# Patient Record
Sex: Female | Born: 1935
Health system: Southern US, Community
[De-identification: ages and names within clinical notes are randomized; demographics above are authoritative.]

## PROBLEM LIST (undated history)

## (undated) DIAGNOSIS — I10 Essential (primary) hypertension: Secondary | ICD-10-CM

## (undated) DIAGNOSIS — E785 Hyperlipidemia, unspecified: Secondary | ICD-10-CM

## (undated) DIAGNOSIS — F32A Depression, unspecified: Secondary | ICD-10-CM

## (undated) DIAGNOSIS — F419 Anxiety disorder, unspecified: Secondary | ICD-10-CM

## (undated) DIAGNOSIS — F0390 Unspecified dementia without behavioral disturbance: Secondary | ICD-10-CM

## (undated) DIAGNOSIS — F329 Major depressive disorder, single episode, unspecified: Secondary | ICD-10-CM

## (undated) DIAGNOSIS — A310 Pulmonary mycobacterial infection: Secondary | ICD-10-CM

## (undated) DIAGNOSIS — C4490 Unspecified malignant neoplasm of skin, unspecified: Secondary | ICD-10-CM

## (undated) DIAGNOSIS — K589 Irritable bowel syndrome without diarrhea: Secondary | ICD-10-CM

## (undated) HISTORY — DX: Essential (primary) hypertension: I10

## (undated) HISTORY — DX: Unspecified malignant neoplasm of skin, unspecified: C44.90

## (undated) HISTORY — DX: Depression, unspecified: F32.A

## (undated) HISTORY — DX: Anxiety disorder, unspecified: F41.9

## (undated) HISTORY — PX: ABDOMINAL HYSTERECTOMY: SHX81

## (undated) HISTORY — DX: Major depressive disorder, single episode, unspecified: F32.9

## (undated) HISTORY — DX: Hyperlipidemia, unspecified: E78.5

## (undated) HISTORY — DX: Irritable bowel syndrome, unspecified: K58.9

## (undated) HISTORY — DX: Pulmonary mycobacterial infection: A31.0

## (undated) HISTORY — PX: CATARACT EXTRACTION, BILATERAL: SHX1313

## (undated) HISTORY — PX: TONSILLECTOMY: SUR1361

## (undated) HISTORY — PX: BLADDER REPAIR: SHX76

---

## 2000-10-29 LAB — HM COLONOSCOPY: HM Colonoscopy: NORMAL

## 2001-04-23 DIAGNOSIS — A310 Pulmonary mycobacterial infection: Secondary | ICD-10-CM

## 2001-04-23 HISTORY — DX: Pulmonary mycobacterial infection: A31.0

## 2009-09-26 ENCOUNTER — Ambulatory Visit: Payer: Self-pay | Admitting: Internal Medicine

## 2010-12-26 ENCOUNTER — Encounter: Payer: Self-pay | Admitting: Internal Medicine

## 2010-12-26 ENCOUNTER — Other Ambulatory Visit: Payer: Self-pay | Admitting: Internal Medicine

## 2010-12-26 ENCOUNTER — Ambulatory Visit (INDEPENDENT_AMBULATORY_CARE_PROVIDER_SITE_OTHER): Payer: Medicare Other | Admitting: Internal Medicine

## 2010-12-26 VITALS — BP 110/66 | HR 66 | Temp 98.1°F | Resp 12 | Ht <= 58 in | Wt 123.8 lb

## 2010-12-26 DIAGNOSIS — F329 Major depressive disorder, single episode, unspecified: Secondary | ICD-10-CM | POA: Insufficient documentation

## 2010-12-26 DIAGNOSIS — F3289 Other specified depressive episodes: Secondary | ICD-10-CM

## 2010-12-26 DIAGNOSIS — I10 Essential (primary) hypertension: Secondary | ICD-10-CM | POA: Insufficient documentation

## 2010-12-26 DIAGNOSIS — A310 Pulmonary mycobacterial infection: Secondary | ICD-10-CM

## 2010-12-26 DIAGNOSIS — E785 Hyperlipidemia, unspecified: Secondary | ICD-10-CM | POA: Insufficient documentation

## 2010-12-26 NOTE — Progress Notes (Signed)
Subjective:    Patient ID: Maria Bass, female    DOB: [birth year], 77 y.o.   MRN: [id number]  HPI Maria Bass is a 77 year old female with a history of MAI who presents for followup. She reports she scheduled this visit for consultation as she has been concerned about chronic cough and increased mucous production over the last few months. She contacted her pulmonary physician, Dr. Valentina Lucks, at [facility] after an episode of hemoptysis. He instructed her that if this was a single event he did not feel that she needed to be seen. She has not had any recurrent hemoptysis. She is, however, concerned that she should have chronic pulmonary followup. She request referral to a local pulmonologist. Aside from chronic cough with mucous production which is most prominent in the mornings, she denies any other symptoms. She denies any shortness of breath, fever, weight loss.  Maria Bass also has a history of irritable bowel syndrome. She reports a recent two-week interval with frequent diarrhea, which has now resolved. She would also like to set up a referral to a GI physician. We discussed that she is due for colonoscopy. She denies any current abdominal pain, diarrhea, blood in her stool, or other symptoms.   Outpatient Encounter Prescriptions as of 12/26/2010  Medication Sig Dispense Refill  . enalapril (VASOTEC) 10 MG tablet       . LIPITOR 20 MG tablet       . mirtazapine (REMERON) 45 MG tablet Take 45 mg by mouth as needed.          Review of Systems  Constitutional: Negative for fever, chills and unexpected weight change.  HENT: Negative for ear pain, congestion, sore throat, rhinorrhea, sneezing, mouth sores, trouble swallowing, neck stiffness, voice change, postnasal drip, sinus pressure and ear discharge.   Eyes: Negative for pain, discharge, redness and visual disturbance.  Respiratory: Positive for cough. Negative for chest tightness, shortness of breath, wheezing and stridor.   Cardiovascular:  Negative for chest pain, palpitations and leg swelling.  Gastrointestinal: Positive for diarrhea (intermittant). Negative for abdominal pain and blood in stool.  Musculoskeletal: Negative for myalgias and arthralgias.  Skin: Negative for color change and rash.  Neurological: Negative for dizziness, weakness, light-headedness and headaches.  Hematological: Negative for adenopathy.    BP 110/66  Pulse 66  Temp(Src) 98.1 F (36.7 C) (Oral)  Resp 12  Ht 4\' 4"  (1.321 m)  Wt 123 lb 12 oz (56.133 kg)  BMI 32.18 kg/m2  SpO2 97%     Objective:   Physical Exam  Constitutional: She is oriented to person, place, and time. She appears well-developed and well-nourished. No distress.  HENT:  Head: Normocephalic and atraumatic.  Right Ear: External ear normal.  Left Ear: External ear normal.  Nose: Nose normal.  Eyes: Conjunctivae and EOM are normal. Pupils are equal, round, and reactive to light. Right eye exhibits no discharge. Left eye exhibits no discharge. No scleral icterus.  Neck: Normal range of motion. Neck supple. No tracheal deviation present. No thyromegaly present.  Cardiovascular: Normal rate, regular rhythm, normal heart sounds and intact distal pulses.  Exam reveals no gallop and no friction rub.   No murmur heard. Pulmonary/Chest: Effort normal and breath sounds normal. No respiratory distress. She has no wheezes. She has no rales. She exhibits no tenderness.  Musculoskeletal: Normal range of motion. She exhibits no edema and no tenderness.  Lymphadenopathy:    She has no cervical adenopathy.  Neurological: She is alert and oriented to person,  place, and time. No cranial nerve deficit. She exhibits normal muscle tone. Coordination normal.  Skin: Skin is warm and dry. No rash noted. She is not diaphoretic. No erythema. No pallor.  Psychiatric: She has a normal mood and affect. Her speech is normal and behavior is normal. Judgment and thought content normal. Cognition and memory  are normal.          Assessment & Plan:  1. MAI - patient with a history of MAI, previously followed by Dr. Zackery Barefoot at [facility]. She has had a recurrent single episode of hemoptysis and reports chronic productive cough. She would like to establish care with a local pulmonologist. Exam is normal today. Recommended obtaining a chest x-ray today, however she would prefer to defer this until she is seen by pulmonary medicine. We will set her up with a [facility] pulmonology. We will try to obtain her previous records from Dr. Zackery Barefoot.  2. IBS - patient also has a history of irritable bowel syndrome. She had a recent two-week episode of diarrhea, but denies any symptoms currently. She is due for colonoscopy. We will set her up with GI for both her screening colonoscopy and evaluation of chronic irritable bowel syndrome.

## 2010-12-26 NOTE — Patient Instructions (Signed)
We will set you up with pulmonary. Follow up here in December

## 2011-01-01 ENCOUNTER — Ambulatory Visit: Payer: Self-pay | Admitting: Internal Medicine

## 2011-01-02 ENCOUNTER — Encounter: Payer: Self-pay | Admitting: Internal Medicine

## 2011-01-31 ENCOUNTER — Encounter: Payer: Self-pay | Admitting: Internal Medicine

## 2011-02-20 ENCOUNTER — Telehealth: Payer: Self-pay | Admitting: Internal Medicine

## 2011-02-20 ENCOUNTER — Ambulatory Visit (INDEPENDENT_AMBULATORY_CARE_PROVIDER_SITE_OTHER)
Admission: RE | Admit: 2011-02-20 | Discharge: 2011-02-20 | Disposition: A | Discharge status: Home / Self Care | Payer: Medicare Other | Source: Ambulatory Visit | Attending: Critical Care Medicine | Admitting: Critical Care Medicine

## 2011-02-20 ENCOUNTER — Encounter: Payer: Self-pay | Admitting: Critical Care Medicine

## 2011-02-20 ENCOUNTER — Other Ambulatory Visit: Payer: Medicare Other

## 2011-02-20 ENCOUNTER — Ambulatory Visit (INDEPENDENT_AMBULATORY_CARE_PROVIDER_SITE_OTHER): Payer: Medicare Other | Admitting: Critical Care Medicine

## 2011-02-20 ENCOUNTER — Other Ambulatory Visit: Payer: Self-pay | Admitting: Critical Care Medicine

## 2011-02-20 DIAGNOSIS — J479 Bronchiectasis, uncomplicated: Secondary | ICD-10-CM | POA: Insufficient documentation

## 2011-02-20 NOTE — Progress Notes (Signed)
Subjective:    Patient ID: Maria Bass, female    DOB: [birth year], 77 y.o.   MRN: [id number]  HPI 77 y.o. WF hx of MAI Was followed by Valentina Lucks at [place].  Not curable.  Was rx with ABX for ? Period of time ended 3/07 Has seen Sandy Salaam at [facility].     Now here for second opinion,  Now lives in [place].  Started coughing up blood again. Coughed up blood last episode July 2012.  No chest pain.   Last imaging has been several years.  No fever.  No night sweats.    Has mucus daily that is yellow and thick. Mucus comes mid day or if will lay flat and has to cough up mucus.  Also 5pm in the PM will have mucus. No qhs cough.  No real heartburn. No dysphagia.  Had hemoptysis in 03.  No real sinus issues. Dx bronchiectasis.   Past Medical History  Diagnosis Date  . Hypertension   . Hyperlipidemia   . MAI (mycobacterium avium-intracellulare) 2003  . Depression   . IBS (irritable bowel syndrome)      Family History  Problem Relation Age of Onset  . Heart attack Mother   . Heart attack Father      History   Social History  . Marital Status: Married    Spouse Name: N/A    Number of Children: N/A  . Years of Education: N/A   Occupational History  . Retired    Social History Main Topics  . Smoking status: Former Smoker    Types: Cigarettes    Quit date: 04/23/1978  . Smokeless tobacco: Never Used   Comment: social smoker x 15  . Alcohol Use: Yes     glass a wine at night  . Drug Use: No  . Sexually Active: Not on file   Other Topics Concern  . Not on file   Social History Narrative  . No narrative on file     No Known Allergies   Outpatient Prescriptions Prior to Visit  Medication Sig Dispense Refill  . enalapril (VASOTEC) 10 MG tablet Take 10 mg by mouth daily.       [name] LIPITOR 20 MG tablet Take 10 mg by mouth daily.       . mirtazapine (REMERON) 45 MG tablet Take 45 mg by mouth as needed.            Review of Systems  Constitutional: Negative for fever,  chills, diaphoresis, activity change, appetite change, fatigue and unexpected weight change.  HENT: Positive for congestion and sneezing. Negative for hearing loss, ear pain, nosebleeds, sore throat, facial swelling, rhinorrhea, mouth sores, trouble swallowing, neck pain, neck stiffness, dental problem, voice change, postnasal drip, sinus pressure, tinnitus and ear discharge.   Eyes: Negative for photophobia, discharge, itching and visual disturbance.  Respiratory: Positive for cough. Negative for apnea, choking, chest tightness, shortness of breath, wheezing and stridor.   Cardiovascular: Negative for chest pain, palpitations and leg swelling.  Gastrointestinal: Negative for nausea, vomiting, abdominal pain, constipation, blood in stool and abdominal distention.  Genitourinary: Negative for dysuria, urgency, frequency, hematuria, flank pain, decreased urine volume and difficulty urinating.  Musculoskeletal: Negative for myalgias, back pain, joint swelling, arthralgias and gait problem.  Skin: Negative for color change, pallor and rash.  Neurological: Negative for dizziness, tremors, seizures, syncope, speech difficulty, weakness, light-headedness, numbness and headaches.  Hematological: Negative for adenopathy. Does not bruise/bleed easily.  Psychiatric/Behavioral: Negative for confusion, sleep disturbance  and agitation. The patient is not nervous/anxious.        Objective:   Physical Exam Filed Vitals:   02/20/11 1056  BP: 118/68  Pulse: 65  Temp: 97.9 F (36.6 C)  TempSrc: Oral  Height: 5\' 2"  (1.575 m)  Weight: 123 lb 3.2 oz (55.883 kg)  SpO2: 96%    Gen: Pleasant, well-nourished, in no distress,  normal affect  ENT: No lesions,  mouth clear,  oropharynx clear, no postnasal drip  Neck: No JVD, no TMG, no carotid bruits  Lungs: No use of accessory muscles, no dullness to percussion, scattered bibasilar  rhonchi  Cardiovascular: RRR, heart sounds normal, no murmur or gallops, no  peripheral edema  Abdomen: soft and NT, no HSM,  BS normal  Musculoskeletal: No deformities, no cyanosis or clubbing  Neuro: alert, non focal  Skin: Warm, no lesions or rashes        Assessment & Plan:   Bronchiectasis Bronchiectasis by clinical history and exam with incomplete database Prior history of Mycobacterium avium intracellular  by report need confirmation with old records Patient has active ongoing purulent sputum The patient was able to produce sputum here in the office Plan Obtain pulmonary function studies Obtain CT scan of the chest Obtain old records Reculture sputum for MAI and routine bacteria Stop fish oil Further treatment adjustments will follow Discontinue ACE inhibitor and switch to an ARB this was discussed with the patient's primary care physician on    Updated Medication List Outpatient Encounter Prescriptions as of 02/20/2011  Medication Sig Dispense Refill  . Ascorbic Acid (VITAMIN C) 1000 MG tablet Take 1,000 mg by mouth daily.        . Biotin 1000 MCG tablet Take 1,000 mcg by mouth daily.        . cholecalciferol (VITAMIN D) 1000 UNITS tablet Take 1,000 Units by mouth daily.        . enalapril (VASOTEC) 10 MG tablet Take 10 mg by mouth daily.       [name] LIPITOR 20 MG tablet Take 10 mg by mouth daily.       . mirtazapine (REMERON) 45 MG tablet Take 45 mg by mouth as needed.        [name] DISCONTD: Omega-3 Fatty Acids (FISH OIL) 1000 MG CAPS Take 1 capsule by mouth daily.

## 2011-02-20 NOTE — Telephone Encounter (Signed)
Patient is currently taking Enalapril. Her pulmonologist, Dr. Delford Field, called today and he would like to change her to Losartan, as enalapril can sometimes cause a chronic cough. We can call in Losartan 50mg  daily and have her STOP enalapril.

## 2011-02-20 NOTE — Patient Instructions (Addendum)
I will ask Dr Dan Humphreys to consider switching enalipril to another agent Stop fish oil Records from [facility]/ [place] will be obtained CT Chest will be obtained Sputum culture will be obtained Full pulmonary function study will be obtained No other medication changes for now Return one month, I may ask dr Kendrick Fries to take your case, our new lung doctor in [place] who works out of Dr Tilman Neat office

## 2011-02-21 NOTE — Assessment & Plan Note (Addendum)
Bronchiectasis by clinical history and exam with incomplete database Prior history of Mycobacterium avium intracellular  by report need confirmation with old records Patient has active ongoing purulent sputum The patient was able to produce sputum here in the office Plan Obtain pulmonary function studies Obtain CT scan of the chest Obtain old records Reculture sputum for MAI and routine bacteria Stop fish oil Further treatment adjustments will follow Discontinue ACE inhibitor and switch to an ARB this was discussed with the patient's primary care physician on

## 2011-02-22 LAB — RESPIRATORY CULTURE OR RESPIRATORY AND SPUTUM CULTURE

## 2011-02-23 ENCOUNTER — Telehealth: Payer: Self-pay | Admitting: Critical Care Medicine

## 2011-02-23 DIAGNOSIS — J471 Bronchiectasis with (acute) exacerbation: Secondary | ICD-10-CM

## 2011-02-23 MED ORDER — BUDESONIDE-FORMOTEROL FUMARATE 160-4.5 MCG/ACT IN AERO: 2.0000 | INHALATION_SPRAY | Freq: Two times a day (BID) | RESPIRATORY_TRACT | Status: DC

## 2011-02-23 MED ORDER — MOXIFLOXACIN HCL 400 MG PO TABS: 400.0000 mg | ORAL_TABLET | Freq: Every day | ORAL | Status: AC

## 2011-02-23 MED ORDER — LOSARTAN POTASSIUM 50 MG PO TABS: 50.0000 mg | ORAL_TABLET | Freq: Every day | ORAL | Status: DC

## 2011-02-23 MED ORDER — FLUTTER DEVI: Status: DC

## 2011-02-23 NOTE — Telephone Encounter (Signed)
Patient informed. 

## 2011-02-23 NOTE — Telephone Encounter (Signed)
CT shows cylindrical bronchiectasis  Sputum culture: normal flora Pt aware.  Will start avelox x7days, symbicort bid and flutter valve

## 2011-02-26 ENCOUNTER — Telehealth: Payer: Self-pay | Admitting: Critical Care Medicine

## 2011-02-26 NOTE — Telephone Encounter (Signed)
Spoke with pt. She states that she has appt with PW sched for 03/23/11 for rov with PFT's. She states that after speaking with PW on 02/23/11 about her results, she is thinking that he wants her to be seen sooner than this. She states that she is also questioning whether or not she should hold off on using flutter valve and symbicort until she has PFT's done. PW, pls advise, thanks!

## 2011-02-26 NOTE — Telephone Encounter (Signed)
Stay on all meds and flutter for now Have pt worked in sooner

## 2011-02-26 NOTE — Telephone Encounter (Signed)
LMTCB

## 2011-02-27 ENCOUNTER — Telehealth: Payer: Self-pay | Admitting: Critical Care Medicine

## 2011-02-27 NOTE — Telephone Encounter (Signed)
Spoke with patient-she states she does not have a flutter device-went to get one at [facility] and they don't carry them. I explained to patient we have them here in the office and she can come by and pick one up however the patient lives to far away ([place] area) to come by. I also have scheduled patient for PFT and ROV on 03-13-11; pt could not come in today. I advised patient that I will send this to PW to advise if he is okay with patient waiting until 03-13-11 to get a flutter device as well as the appt times for PFT and OV. PW not in office much between today and appt date scheduled. Will call patient asap.

## 2011-02-27 NOTE — Telephone Encounter (Signed)
Wait until next ov and give pt flutter valve then

## 2011-02-27 NOTE — Telephone Encounter (Signed)
I informed pt of PW's recommendations. Pt verbalized understanding.

## 2011-02-27 NOTE — Telephone Encounter (Signed)
Received copies from [facility],on 02/27/11. Forwarded  6pages to Dr. Sandra Cockayne review.

## 2011-03-01 ENCOUNTER — Telehealth: Payer: Self-pay | Admitting: Critical Care Medicine

## 2011-03-01 NOTE — Telephone Encounter (Signed)
Received 49 pages from [facility], forwarded to Dr. Delford Field for review. 03/01/11-ar

## 2011-03-02 ENCOUNTER — Other Ambulatory Visit: Payer: Self-pay | Admitting: *Deleted

## 2011-03-02 MED ORDER — LOSARTAN POTASSIUM 50 MG PO TABS: 50.0000 mg | ORAL_TABLET | Freq: Every day | ORAL | Status: DC

## 2011-03-02 NOTE — Progress Notes (Signed)
RX mailed to pt

## 2011-03-08 ENCOUNTER — Encounter: Payer: Self-pay | Admitting: Critical Care Medicine

## 2011-03-08 ENCOUNTER — Telehealth: Payer: Self-pay | Admitting: Critical Care Medicine

## 2011-03-08 NOTE — Telephone Encounter (Signed)
I spoke with the patient and advised that Dr. Delford Field was calling to let her know he received the [facility] records and he will discuss at upcoming ov on 03-13-11.  Also the AFB that was ordered on 02-20-11 was never done so the pt will need to try to give another sample at rov. Pt aware.Carron Curie, CMA

## 2011-03-13 ENCOUNTER — Ambulatory Visit (INDEPENDENT_AMBULATORY_CARE_PROVIDER_SITE_OTHER): Payer: Medicare Other | Admitting: Critical Care Medicine

## 2011-03-13 ENCOUNTER — Encounter: Payer: Self-pay | Admitting: Critical Care Medicine

## 2011-03-13 VITALS — BP 98/62 | HR 67 | Temp 98.3°F | Ht 62.0 in | Wt 124.0 lb

## 2011-03-13 DIAGNOSIS — J471 Bronchiectasis with (acute) exacerbation: Secondary | ICD-10-CM

## 2011-03-13 DIAGNOSIS — J479 Bronchiectasis, uncomplicated: Secondary | ICD-10-CM

## 2011-03-13 LAB — PULMONARY FUNCTION TEST

## 2011-03-13 MED ORDER — FLUTTER DEVI: Status: DC

## 2011-03-13 MED ORDER — BUDESONIDE-FORMOTEROL FUMARATE 160-4.5 MCG/ACT IN AERO: INHALATION_SPRAY | RESPIRATORY_TRACT | Status: DC

## 2011-03-13 MED ORDER — BUDESONIDE 180 MCG/ACT IN AEPB: 1.0000 | INHALATION_SPRAY | Freq: Two times a day (BID) | RESPIRATORY_TRACT | Status: DC

## 2011-03-13 NOTE — Patient Instructions (Signed)
When symbicort runs out , start pulmicort two puff twice daily Obtain a sputum for AFB and drop off at Dr Tilman Neat office to process Flutter valve 3 times daily Next appt with dr Kipp Brood mcquaid in two months in [place]

## 2011-03-13 NOTE — Assessment & Plan Note (Signed)
Bronchiectasis by clinical history and exam with incomplete database Prior history of Mycobacterium avium intracellular  by report need confirmation with old records CT Chest 10/12: diffuse cylindrical bronchiectasis Records from Drs Gladys Damme and Valentina Lucks [facility]: Dx Bronchiectasis Francetta Found  06/2001: hemoptysis, bronchiectasis/nodules on CT Chest.  Rx Azithro/Rif/ETH daily  06/2001 >>04/2003 reduced to thrice weekly>>>persistent pos smears/cultures.>>>D/C meds 02/2004 with pos cultures but minimal symptoms. 04/2004 Resumed Rx with Neb Amikacin 500/d, ETH/Azithro daily plus 3% NaCL neb.>>>stopped Rx 06/2004 >>>01/2005 AFB smear/cult neg x 3. ID clinic OVs 10/08>>more mucus, rx with neb 3% NaCl only Last seen [facility] 06/2008,  Min mucus and cough, no other issues. Due to asymptomatic for 88yrs, pt released from [facility] ID clinic 06/2008 Note MAC culture sent to [facility] 06/2002:  R to moxifloxacin,  Res to Amikacin, R to Cipro, Sus to Biaxin, R to Rifampin, Res to Kanamycin,  intermed to [facility] notes:  04/2003--02/2004:  FVC 105%  Fev1 107%  11/05 referral to Janace Hoard made .   ? Mass in L lung ? Resection, ultimately was watched in non smoker.  CT [facility] 09/2005:  Centrilobular nodules, tree in bud, assoc bronchiectasis diffuse, all lobes.  09/2005 sputum cult  pos MAI 3 colonies only, smear neg.  MAI/bronchiectasis with mucus plugging.  Doubt will ever clear this pt from all MAI .  Need to check current AFB status.  Note PFTs normal now.  Needs to be on mucoclearance program. Pt does not need LABA. Plan Migrate from symbicort to pulmicort 2puff bid Flutter valve tid-qid No furhter ABX Refer to Dr Kendrick Fries in [place] office for f/u for more convenient location for the pt

## 2011-03-13 NOTE — Progress Notes (Signed)
PFT done today. 

## 2011-03-13 NOTE — Progress Notes (Signed)
Subjective:    Patient ID: Maria Bass, female    DOB: 1929/05/14, 77 y.o.   MRN: [id number]  HPI  77 y.o. WF hx of MAI/bronchiectasis  03/13/2011 Overall much improved with reduction in mucus production.   Pt denies any significant sore throat, nasal congestion or excess secretions, fever, chills, sweats, unintended weight loss, pleurtic or exertional chest pain, orthopnea PND, or leg swelling Pt denies any increase in rescue therapy over baseline, denies waking up needing it or having any early am or nocturnal exacerbations of coughing/wheezing/or dyspnea. Pt also denies any obvious fluctuation in symptoms with  weather or environmental change or other alleviating or aggravating factors  Now on symbicort.  Off all ABX.   Past Medical History  Diagnosis Date  . Hypertension   . Hyperlipidemia   . MAI (mycobacterium avium-intracellulare) 2003  . Depression   . IBS (irritable bowel syndrome)      Family History  Problem Relation Age of Onset  . Heart attack Mother   . Heart attack Father      History   Social History  . Marital Status: Married    Spouse Name: N/A    Number of Children: N/A  . Years of Education: N/A   Occupational History  . Retired    Social History Main Topics  . Smoking status: Former Smoker    Types: Cigarettes    Quit date: 04/23/1978  . Smokeless tobacco: Never Used   Comment: social smoker x 15  . Alcohol Use: Yes     glass a wine at night  . Drug Use: No  . Sexually Active: Not on file   Other Topics Concern  . Not on file   Social History Narrative  . No narrative on file     No Known Allergies   Outpatient Prescriptions Prior to Visit  Medication Sig Dispense Refill  . Ascorbic Acid (VITAMIN C) 1000 MG tablet Take 1,000 mg by mouth daily.        . Biotin 1000 MCG tablet Take 1,000 mcg by mouth daily.        . cholecalciferol (VITAMIN D) 1000 UNITS tablet Take 1,000 Units by mouth daily.        [name] LIPITOR 20 MG tablet  Take 20 mg by mouth daily.       [name] losartan (COZAAR) 50 MG tablet Take 1 tablet (50 mg total) by mouth daily.  90 tablet  3  . mirtazapine (REMERON) 45 MG tablet Take 45 mg by mouth as needed.        . budesonide-formoterol (SYMBICORT) 160-4.5 MCG/ACT inhaler Inhale 2 puffs into the lungs 2 (two) times daily.  1 Inhaler  12  . Respiratory Therapy Supplies (FLUTTER) DEVI Use 3-4 times daily  1 each  0      Review of Systems  Constitutional: Negative for fever, chills, diaphoresis, activity change, appetite change, fatigue and unexpected weight change.  HENT: Positive for congestion and sneezing. Negative for hearing loss, ear pain, nosebleeds, sore throat, facial swelling, rhinorrhea, mouth sores, trouble swallowing, neck pain, neck stiffness, dental problem, voice change, postnasal drip, sinus pressure, tinnitus and ear discharge.   Eyes: Negative for photophobia, discharge, itching and visual disturbance.  Respiratory: Positive for cough. Negative for apnea, choking, chest tightness, shortness of breath, wheezing and stridor.   Cardiovascular: Negative for chest pain, palpitations and leg swelling.  Gastrointestinal: Negative for nausea, vomiting, abdominal pain, constipation, blood in stool and abdominal distention.  Genitourinary: Negative for dysuria,  urgency, frequency, hematuria, flank pain, decreased urine volume and difficulty urinating.  Musculoskeletal: Negative for myalgias, back pain, joint swelling, arthralgias and gait problem.  Skin: Negative for color change, pallor and rash.  Neurological: Negative for dizziness, tremors, seizures, syncope, speech difficulty, weakness, light-headedness, numbness and headaches.  Hematological: Negative for adenopathy. Does not bruise/bleed easily.  Psychiatric/Behavioral: Negative for confusion, sleep disturbance and agitation. The patient is not nervous/anxious.        Objective:   Physical Exam  Filed Vitals:   03/13/11 1342  BP: 98/62   Pulse: 67  Temp: 98.3 F (36.8 C)  TempSrc: Oral  Height: 5\' 2"  (1.575 m)  Weight: 124 lb (56.246 kg)  SpO2: 94%    Gen: Pleasant, well-nourished, in no distress,  normal affect  ENT: No lesions,  mouth clear,  oropharynx clear, no postnasal drip  Neck: No JVD, no TMG, no carotid bruits  Lungs: No use of accessory muscles, no dullness to percussion, decreased  bibasilar  rhonchi  Cardiovascular: RRR, heart sounds normal, no murmur or gallops, no peripheral edema  Abdomen: soft and NT, no HSM,  BS normal  Musculoskeletal: No deformities, no cyanosis or clubbing  Neuro: alert, non focal  Skin: Warm, no lesions or rashes        Assessment & Plan:   Bronchiectasis Bronchiectasis by clinical history and exam with incomplete database Prior history of Mycobacterium avium intracellular  by report need confirmation with old records CT Chest 10/12: diffuse cylindrical bronchiectasis Records from Drs Gladys Damme and Valentina Lucks [facility]: Dx Bronchiectasis Francetta Found  06/2001: hemoptysis, bronchiectasis/nodules on CT Chest.  Rx Azithro/Rif/ETH daily  06/2001 >>04/2003 reduced to thrice weekly>>>persistent pos smears/cultures.>>>D/C meds 02/2004 with pos cultures but minimal symptoms. 04/2004 Resumed Rx with Neb Amikacin 500/d, ETH/Azithro daily plus 3% NaCL neb.>>>stopped Rx 06/2004 >>>01/2005 AFB smear/cult neg x 3. ID clinic OVs 10/08>>more mucus, rx with neb 3% NaCl only Last seen [facility] 06/2008,  Min mucus and cough, no other issues. Due to asymptomatic for 37yrs, pt released from [facility] ID clinic 06/2008 Note MAC culture sent to [facility] 06/2002:  R to moxifloxacin,  Res to Amikacin, R to Cipro, Sus to Biaxin, R to Rifampin, Res to Kanamycin,  intermed to [facility] notes:  04/2003--02/2004:  FVC 105%  Fev1 107%  11/05 referral to [facility] made .   ? Mass in L lung ? Resection, ultimately was watched in non smoker.  CT CHest 09/2005:  Centrilobular nodules, tree in bud, assoc bronchiectasis  diffuse, all lobes.  09/2005 sputum cult  pos MAI 3 colonies only, smear neg.  MAI/bronchiectasis with mucus plugging.  Doubt will ever clear this pt from all MAI .  Need to check current AFB status.  Note PFTs normal now.  Needs to be on mucoclearance program. Pt does not need LABA. Plan Migrate from symbicort to pulmicort 2puff bid Flutter valve tid-qid No furhter ABX Refer to Dr Kendrick Fries in [place] office for f/u for more convenient location for the pt     Updated Medication List Outpatient Encounter Prescriptions as of 03/13/2011  Medication Sig Dispense Refill  . Ascorbic Acid (VITAMIN C) 1000 MG tablet Take 1,000 mg by mouth daily.        . Biotin 1000 MCG tablet Take 1,000 mcg by mouth daily.        . budesonide-formoterol (SYMBICORT) 160-4.5 MCG/ACT inhaler Stop when current inhaler runs out  1 Inhaler  12  . cholecalciferol (VITAMIN D) 1000 UNITS tablet  Take 1,000 Units by mouth daily.        [name] LIPITOR 20 MG tablet Take 20 mg by mouth daily.       [name] losartan (COZAAR) 50 MG tablet Take 1 tablet (50 mg total) by mouth daily.  90 tablet  3  . mirtazapine (REMERON) 45 MG tablet Take 45 mg by mouth as needed.        [name] Respiratory Therapy Supplies (FLUTTER) DEVI Use 3-4 times daily  1 each  0  . DISCONTD: budesonide-formoterol (SYMBICORT) 160-4.5 MCG/ACT inhaler Inhale 2 puffs into the lungs 2 (two) times daily.  1 Inhaler  12  . DISCONTD: Respiratory Therapy Supplies (FLUTTER) DEVI Use 3-4 times daily  1 each  0  . budesonide (PULMICORT FLEXHALER) 180 MCG/ACT inhaler Inhale 1 puff into the lungs 2 (two) times daily. Start after symbicort runs out  1 each  6

## 2011-03-20 ENCOUNTER — Telehealth: Payer: Self-pay | Admitting: Internal Medicine

## 2011-03-20 NOTE — Telephone Encounter (Signed)
Kidney and liver function normal on labs from 03/19/2011

## 2011-03-21 ENCOUNTER — Ambulatory Visit: Payer: Medicare Other | Admitting: Critical Care Medicine

## 2011-03-21 NOTE — Telephone Encounter (Signed)
Patient informed. 

## 2011-03-23 ENCOUNTER — Ambulatory Visit: Payer: Medicare Other | Admitting: Critical Care Medicine

## 2011-03-26 ENCOUNTER — Telehealth: Payer: Self-pay | Admitting: Internal Medicine

## 2011-03-26 ENCOUNTER — Telehealth: Payer: Self-pay | Admitting: Critical Care Medicine

## 2011-03-26 ENCOUNTER — Encounter: Payer: Self-pay | Admitting: Internal Medicine

## 2011-03-26 ENCOUNTER — Ambulatory Visit (INDEPENDENT_AMBULATORY_CARE_PROVIDER_SITE_OTHER): Payer: Medicare Other | Admitting: Internal Medicine

## 2011-03-26 VITALS — BP 110/62 | HR 63 | Temp 98.2°F | Wt 122.0 lb

## 2011-03-26 DIAGNOSIS — I10 Essential (primary) hypertension: Secondary | ICD-10-CM

## 2011-03-26 DIAGNOSIS — J479 Bronchiectasis, uncomplicated: Secondary | ICD-10-CM

## 2011-03-26 DIAGNOSIS — E785 Hyperlipidemia, unspecified: Secondary | ICD-10-CM

## 2011-03-26 MED ORDER — ATORVASTATIN CALCIUM 20 MG PO TABS: 20.0000 mg | ORAL_TABLET | Freq: Every day | ORAL | Status: DC

## 2011-03-26 NOTE — Telephone Encounter (Signed)
[phone] I called [facility] and spoke with lisa she had no record of Ms Radloff having the pneumonia vaccine.  Also called pulmonary at [facility] the are going to call ms Pollick about the nurse visit

## 2011-03-26 NOTE — Progress Notes (Signed)
Subjective:    Patient ID: Maria Bass, female    DOB: 1929-05-18, 77 y.o.   MRN: [id number]  HPI 77 year old female with history of MAI and bronchiectasis as well as hypertension and hyperlipidemia presents for followup. She reports that she's been feeling very well. She notes that her pulmonary physician is planning to start her on a flutter device. She is also transitioning from Symbicort 2 Pulmicort. She reports that her cough is improved. She continues to have occasional cough which is productive of yellow sputum. She denies any shortness of breath. She denies any fever or chills.  In regards to her hypertension and hyperlipidemia, she reports full compliance with her medications. She brings record of recent lab work performed in November 2012 which showed normal kidney and liver function as well as a normal urine microalbumin.  Outpatient Encounter Prescriptions as of 03/26/2011  Medication Sig Dispense Refill  . Ascorbic Acid (VITAMIN C) 1000 MG tablet Take 1,000 mg by mouth daily.        . Biotin 1000 MCG tablet Take 1,000 mcg by mouth daily.        . budesonide (PULMICORT FLEXHALER) 180 MCG/ACT inhaler Inhale 1 puff into the lungs 2 (two) times daily. Start after symbicort runs out  1 each  6  . budesonide-formoterol (SYMBICORT) 160-4.5 MCG/ACT inhaler Stop when current inhaler runs out  1 Inhaler  12  . cholecalciferol (VITAMIN D) 1000 UNITS tablet Take 1,000 Units by mouth daily.        [name] LIPITOR 20 MG tablet Take 20 mg by mouth daily.       [name] losartan (COZAAR) 50 MG tablet Take 1 tablet (50 mg total) by mouth daily.  90 tablet  3  . mirtazapine (REMERON) 45 MG tablet Take 45 mg by mouth as needed.        [name] Respiratory Therapy Supplies (FLUTTER) DEVI Use 3-4 times daily  1 each  0    Review of Systems  Constitutional: Negative for fever, chills, appetite change, fatigue and unexpected weight change.  HENT: Negative for ear pain, congestion, sore throat, trouble swallowing,  neck pain, voice change and sinus pressure.   Eyes: Negative for visual disturbance.  Respiratory: Negative for cough, shortness of breath, wheezing and stridor.   Cardiovascular: Negative for chest pain, palpitations and leg swelling.  Gastrointestinal: Negative for nausea, vomiting, abdominal pain, diarrhea, constipation, blood in stool, abdominal distention and anal bleeding.  Genitourinary: Negative for dysuria and flank pain.  Musculoskeletal: Negative for myalgias, arthralgias and gait problem.  Skin: Negative for color change and rash.  Neurological: Negative for dizziness and headaches.  Hematological: Negative for adenopathy. Does not bruise/bleed easily.  Psychiatric/Behavioral: Negative for suicidal ideas, sleep disturbance and dysphoric mood. The patient is not nervous/anxious.    BP 110/62  Pulse 63  Temp(Src) 98.2 F (36.8 C) (Oral)  Wt 122 lb (55.339 kg)  SpO2 97%     Objective:   Physical Exam  Constitutional: She is oriented to person, place, and time. She appears well-developed and well-nourished. No distress.  HENT:  Head: Normocephalic and atraumatic.  Right Ear: External ear normal.  Left Ear: External ear normal.  Nose: Nose normal.  Mouth/Throat: Oropharynx is clear and moist. No oropharyngeal exudate.  Eyes: Conjunctivae are normal. Pupils are equal, round, and reactive to light. Right eye exhibits no discharge. Left eye exhibits no discharge. No scleral icterus.  Neck: Normal range of motion. Neck supple. No tracheal deviation present. No thyromegaly present.  Cardiovascular: Normal rate, regular rhythm, normal heart sounds and intact distal pulses.  Exam reveals no gallop and no friction rub.   No murmur heard. Pulmonary/Chest: Effort normal. No respiratory distress. She has decreased breath sounds (prolonged exp phase). She has no wheezes. She has no rales. She exhibits no tenderness.  Musculoskeletal: Normal range of motion. She exhibits no edema and no  tenderness.  Lymphadenopathy:    She has no cervical adenopathy.  Neurological: She is alert and oriented to person, place, and time. No cranial nerve deficit. She exhibits normal muscle tone. Coordination normal.  Skin: Skin is warm and dry. No rash noted. She is not diaphoretic. No erythema. No pallor.  Psychiatric: She has a normal mood and affect. Her behavior is normal. Judgment and thought content normal.          Assessment & Plan:  1. Hypertension -patient was recently transitioned from an ACE inhibitor to losartan. Her blood pressure is well-controlled today. Recent renal function in November 2012 is normal. Will plan to repeat renal function and urine microalbumin in May 2013.  2. Hyperlipidemia -will plan to recheck lipids in May of 2013. We'll continue Lipitor.  3.Bronchiectasis - followed by pulmonology. Patient reports improvement after recent round of antibiotics and treatment with Symbicort. She is planning to transition to Pulmicort. She is also planning to use a flutter device. She is unsure how to use this so we will set her up with the pulmonary nurse here in our clinic. She will followup with pulmonary in 2 months.

## 2011-03-26 NOTE — Telephone Encounter (Signed)
I spoke with pt and she states that she needs a time that she can go out to the [place] office to be shown how to use the flutter valve. Pt already has flutter valve. Pt did not want to come to [place] office to be shown.  I spoke with leslie and she states have pt come in Monday to be shown. Pt states she can't come in until Tuesday around 1:30-2. I advised pt that was fine and leslie is aware of this.

## 2011-03-26 NOTE — Telephone Encounter (Signed)
OK. It would be best if we could have the nurse visit here at [facility] with Dr. Ulyses Jarred nurse.  We should also bring her in for pneumonia vaccine.

## 2011-03-27 ENCOUNTER — Telehealth: Payer: Self-pay | Admitting: Critical Care Medicine

## 2011-03-27 NOTE — Telephone Encounter (Signed)
Spoke with pt she had her pneumonia shot 11/98 in [place]   Does she still need the pneumonia shot FYI She will be talking to lesley Dr Corey Skains nurse on 04/03/11 around 1:30 -200

## 2011-03-27 NOTE — Telephone Encounter (Signed)
Spoke to pt husband.  Made appointment for nurse visit 04/03/11 same day as ms Melman will see lesley dr Kendrick Fries nurse

## 2011-03-27 NOTE — Telephone Encounter (Signed)
Yes, please repeat pneumovax

## 2011-03-27 NOTE — Telephone Encounter (Signed)
Per phone note dated 03/26/11- the pt is to come to [facility] on Tues 04/03/11 between 1:30 and 2 pm so I can show her how to use the flutter valve. I called and spoke with Erie Noe and she is to relay the msg to [place] and have her call me back with any questions/concerns.

## 2011-03-28 ENCOUNTER — Encounter: Payer: Self-pay | Admitting: Critical Care Medicine

## 2011-03-30 ENCOUNTER — Telehealth: Payer: Self-pay | Admitting: Pulmonary Disease

## 2011-03-30 NOTE — Telephone Encounter (Signed)
I had spoke with the pt and she had agreed to come in on Monday 04/02/11 for me to teach her the flutter valve. There was no mention of pneumovax when I spoke with her.   TD tried calling Carollee Herter to discuss this issue and was placed on hold for 8 minutes. Will await a call back. TD to speak with [facility].

## 2011-04-02 ENCOUNTER — Ambulatory Visit (INDEPENDENT_AMBULATORY_CARE_PROVIDER_SITE_OTHER): Payer: Medicare Other | Admitting: *Deleted

## 2011-04-02 DIAGNOSIS — Z23 Encounter for immunization: Secondary | ICD-10-CM

## 2011-04-02 NOTE — Telephone Encounter (Signed)
Per TD- spoke with Erie Noe and this has been handled- pt to get pneumovax at nurse visit today and I will teach her the flutter valve as planned. Nothing further needed.

## 2011-04-03 ENCOUNTER — Ambulatory Visit: Payer: Medicare Other

## 2011-05-01 ENCOUNTER — Telehealth: Payer: Self-pay | Admitting: Internal Medicine

## 2011-05-01 NOTE — Telephone Encounter (Signed)
Appointment 05/04/11 @ 3  Pt aware of appointment

## 2011-05-01 NOTE — Telephone Encounter (Signed)
The pt was seen by Dr. Delford Field on 03/13/11 and was advised to schedule 2 month rov with Dr. Kendrick Fries after that visit. So she is due to see [place] this month. I am unsure why she never scheduled the appt, but we have openings this wk so you can schedule her to be seen then. Thanks!

## 2011-05-04 ENCOUNTER — Encounter: Payer: Self-pay | Admitting: Pulmonary Disease

## 2011-05-04 ENCOUNTER — Ambulatory Visit (INDEPENDENT_AMBULATORY_CARE_PROVIDER_SITE_OTHER): Payer: Medicare Other | Admitting: Pulmonary Disease

## 2011-05-04 DIAGNOSIS — J479 Bronchiectasis, uncomplicated: Secondary | ICD-10-CM

## 2011-05-04 DIAGNOSIS — R011 Cardiac murmur, unspecified: Secondary | ICD-10-CM | POA: Diagnosis not present

## 2011-05-04 NOTE — Patient Instructions (Signed)
If and when you produce sputum, please give [place] a sample and bring it by the office for a culture. Otherwise, continue your medications as you are doing. We will see you back in 3 months.

## 2011-05-04 NOTE — Progress Notes (Signed)
Subjective:    Patient ID: Maria Bass, female    DOB: [birth year], 78 y.o.   MRN: [id number]  HPI This is a very pleasant 78 y/o female followed by Drs. Francis Dowse, and Ramond Dial previously for bronchiectasis believed to be due to MAI.  She underwent antibiotic treatment in both 2003-2005 and 2006.  She states that she doesn't think the antibiotics helped much.  She has occassional flares requiting antibiotics and steroids.  She was recently treated with a steroids taper and did well with it.  She states that the flutter valve does not help with her sputum production as she has made very little since taking the steroids.  She continues to exercise daily and feels quite well today.  Past Medical History  Diagnosis Date  . Hypertension   . Hyperlipidemia   . MAI (mycobacterium avium-intracellulare) 2003  . Depression   . IBS (irritable bowel syndrome)      Family History  Problem Relation Age of Onset  . Heart attack Mother   . Heart attack Father      History   Social History  . Marital Status: Married    Spouse Name: N/A    Number of Children: N/A  . Years of Education: N/A   Occupational History  . Retired    Social History Main Topics  . Smoking status: Former Smoker    Types: Cigarettes    Quit date: 04/23/1978  . Smokeless tobacco: Never Used   Comment: social smoker x 15  . Alcohol Use: Yes     glass a wine at night  . Drug Use: No  . Sexually Active: Not on file   Other Topics Concern  . Not on file   Social History Narrative  . No narrative on file     No Known Allergies   Outpatient Prescriptions Prior to Visit  Medication Sig Dispense Refill  . Ascorbic Acid (VITAMIN C) 1000 MG tablet Take 1,000 mg by mouth daily.        [name] atorvastatin (LIPITOR) 20 MG tablet Take 1 tablet (20 mg total) by mouth daily.  90 tablet  3  . Biotin 1000 MCG tablet Take 1,000 mcg by mouth daily.        . budesonide (PULMICORT FLEXHALER) 180 MCG/ACT inhaler Inhale 1  puff into the lungs 2 (two) times daily. Start after symbicort runs out  1 each  6  . cholecalciferol (VITAMIN D) 1000 UNITS tablet Take 1,000 Units by mouth daily.        [name] losartan (COZAAR) 50 MG tablet Take 1 tablet (50 mg total) by mouth daily.  90 tablet  3  . mirtazapine (REMERON) 45 MG tablet Take 45 mg by mouth as needed.        [name] Respiratory Therapy Supplies (FLUTTER) DEVI Use 3-4 times daily  1 each  0  . budesonide-formoterol (SYMBICORT) 160-4.5 MCG/ACT inhaler Stop when current inhaler runs out  1 Inhaler  12       Review of Systems  Constitutional: Negative for fever, chills and unexpected weight change.  HENT: Negative for ear pain, nosebleeds, congestion, sore throat, rhinorrhea, sneezing, trouble swallowing, dental problem, voice change, postnasal drip and sinus pressure.   Eyes: Negative for visual disturbance.  Respiratory: Negative for cough, choking and shortness of breath.   Cardiovascular: Negative for chest pain and leg swelling.  Gastrointestinal: Negative for vomiting, abdominal pain and diarrhea.  Genitourinary: Negative for difficulty urinating.  Musculoskeletal: Negative for arthralgias.  Skin:  Negative for rash.  Neurological: Negative for tremors, syncope and headaches.  Hematological: Does not bruise/bleed easily.       Objective:   Physical Exam  Filed Vitals:   05/04/11 1457  BP: 106/60  Pulse: 76  Temp: 97.6 F (36.4 C)  TempSrc: Oral  Height: 5\' 2"  (1.575 m)  Weight: 56.155 kg (123 lb 12.8 oz)  SpO2: 98%   Gen: well appearing, no acute distress HEENT: NCAT, PERRL, EOMi, OP clear, neck supple without masses PULM: Insp crackles in bases bilaterally L > R (minimal), some rhonchi in upper lobes CV: RRR, systolic murmur RUSB crecs/descr, no JVD AB: BS+, soft, nontender, no hsm Ext: warm, no edema, no clubbing, no cyanosis Derm: no rash or skin breakdown Neuro: A&Ox4, CN II-XII intact, strength 5/5 in all 4 extremities       Assessment  & Plan:   Bronchiectasis This has been a stable interval for Maria Bass as she has very little sputum production and is doing well after a recent steroid taper.  She feels that the pulmicort is working well.  She continues to exercise regularly.  Plan: -keep the flutter valve, use when sputum production picks back up -give [place] a sputum culture when you can produce it -continue pulmicort -rtc 3 months.  Heart murmur Heart murmur, sounds aortic in nature.  We discussed getting an echo vs. discussing with PCP, she would prefer to discuss it with Dr. Dan Humphreys.  She does not have symptoms from it so this sounds reasonable.    Updated Medication List Outpatient Encounter Prescriptions as of 05/04/2011  Medication Sig Dispense Refill  . Ascorbic Acid (VITAMIN C) 1000 MG tablet Take 1,000 mg by mouth daily.        [name] atorvastatin (LIPITOR) 20 MG tablet Take 1 tablet (20 mg total) by mouth daily.  90 tablet  3  . Biotin 1000 MCG tablet Take 1,000 mcg by mouth daily.        . budesonide (PULMICORT FLEXHALER) 180 MCG/ACT inhaler Inhale 1 puff into the lungs 2 (two) times daily. Start after symbicort runs out  1 each  6  . cholecalciferol (VITAMIN D) 1000 UNITS tablet Take 1,000 Units by mouth daily.        [name] losartan (COZAAR) 50 MG tablet Take 1 tablet (50 mg total) by mouth daily.  90 tablet  3  . mirtazapine (REMERON) 45 MG tablet Take 45 mg by mouth as needed.        [name] Respiratory Therapy Supplies (FLUTTER) DEVI Use 3-4 times daily  1 each  0  . DISCONTD: budesonide-formoterol (SYMBICORT) 160-4.5 MCG/ACT inhaler Stop when current inhaler runs out  1 Inhaler  12

## 2011-05-04 NOTE — Assessment & Plan Note (Signed)
Heart murmur, sounds aortic in nature.  We discussed getting an echo vs. discussing with PCP, she would prefer to discuss it with Dr. Dan Humphreys.  She does not have symptoms from it so this sounds reasonable.

## 2011-05-04 NOTE — Assessment & Plan Note (Signed)
This has been a stable interval for [name] as she has very little sputum production and is doing well after a recent steroid taper.  She feels that the pulmicort is working well.  She continues to exercise regularly.  Plan: -keep the flutter valve, use when sputum production picks back up -give [place] a sputum culture when you can produce it -continue pulmicort -rtc 3 months.

## 2011-06-13 ENCOUNTER — Telehealth: Payer: Self-pay | Admitting: *Deleted

## 2011-06-13 NOTE — Telephone Encounter (Signed)
Pt left VM w/me b/c she was unsure how to leave a VM for Dr Kendrick Fries. She wants to know if she should continue pulmicort. Please let pt know, THANKS!!

## 2011-06-14 NOTE — Telephone Encounter (Signed)
Spoke with pt and advised that yes- she needs to stay on pulmicort. I gave her the number for our [place] office and advised if has questions/concerns to call our office. She verbalized understanding and states nothing further needed.

## 2011-06-27 ENCOUNTER — Other Ambulatory Visit: Payer: Self-pay | Admitting: *Deleted

## 2011-06-27 DIAGNOSIS — E785 Hyperlipidemia, unspecified: Secondary | ICD-10-CM

## 2011-06-27 MED ORDER — ATORVASTATIN CALCIUM 20 MG PO TABS: 20.0000 mg | ORAL_TABLET | Freq: Every day | ORAL | Status: DC

## 2011-08-06 ENCOUNTER — Ambulatory Visit (INDEPENDENT_AMBULATORY_CARE_PROVIDER_SITE_OTHER): Payer: Medicare Other | Admitting: Pulmonary Disease

## 2011-08-06 DIAGNOSIS — J479 Bronchiectasis, uncomplicated: Secondary | ICD-10-CM

## 2011-08-06 NOTE — Progress Notes (Deleted)
Subjective:    Patient ID: Maria Bass, female    DOB: Nov 07, 1929, 78 y.o.   MRN: [id number]  Synopsis: Maria Bass was referred to the [facility] [place] office in January 2013 after being  followed by Drs. Francis Dowse, and Ramond Dial previously for bronchiectasis believed to be due to MAI.  She underwent antibiotic treatment in both 2003-2005 and 2006.  She states that she doesn't think the antibiotics helped much.  She had occassional flares requiting antibiotics and steroids.  She was recently treated with a steroids taper and did well with it.  She stated then that the flutter valve does not help with her sputum production as she has made very little since taking the steroids.  HPI   Past Medical History  Diagnosis Date  . Hypertension   . Hyperlipidemia   . MAI (mycobacterium avium-intracellulare) 2003  . Depression   . IBS (irritable bowel syndrome)      Family History  Problem Relation Age of Onset  . Heart attack Mother   . Heart attack Father      History   Social History  . Marital Status: Married    Spouse Name: N/A    Number of Children: N/A  . Years of Education: N/A   Occupational History  . Retired    Social History Main Topics  . Smoking status: Former Smoker    Types: Cigarettes    Quit date: 04/23/1978  . Smokeless tobacco: Never Used   Comment: social smoker x 15  . Alcohol Use: Yes     glass a wine at night  . Drug Use: No  . Sexually Active: Not on file   Other Topics Concern  . Not on file   Social History Narrative  . No narrative on file     No Known Allergies   Outpatient Prescriptions Prior to Visit  Medication Sig Dispense Refill  . Ascorbic Acid (VITAMIN C) 1000 MG tablet Take 1,000 mg by mouth daily.        [name] atorvastatin (LIPITOR) 20 MG tablet Take 1 tablet (20 mg total) by mouth daily.  90 tablet  3  . Biotin 1000 MCG tablet Take 1,000 mcg by mouth daily.        . budesonide (PULMICORT FLEXHALER) 180 MCG/ACT inhaler  Inhale 1 puff into the lungs 2 (two) times daily. Start after symbicort runs out  1 each  6  . cholecalciferol (VITAMIN D) 1000 UNITS tablet Take 1,000 Units by mouth daily.        [name] losartan (COZAAR) 50 MG tablet Take 1 tablet (50 mg total) by mouth daily.  90 tablet  3  . mirtazapine (REMERON) 45 MG tablet Take 45 mg by mouth as needed.        [name] Respiratory Therapy Supplies (FLUTTER) DEVI Use 3-4 times daily  1 each  0       Review of Systems  Constitutional: Negative for fever, chills and unexpected weight change.  HENT: Negative for ear pain, nosebleeds, congestion, sore throat, rhinorrhea, sneezing, trouble swallowing, dental problem, voice change, postnasal drip and sinus pressure.   Eyes: Negative for visual disturbance.  Respiratory: Negative for cough, choking and shortness of breath.   Cardiovascular: Negative for chest pain and leg swelling.  Gastrointestinal: Negative for vomiting, abdominal pain and diarrhea.  Genitourinary: Negative for difficulty urinating.  Musculoskeletal: Negative for arthralgias.  Skin: Negative for rash.  Neurological: Negative for tremors, syncope and headaches.  Hematological: Does not bruise/bleed easily.  Objective:   Physical Exam   There were no vitals filed for this visit. Gen: well appearing, no acute distress HEENT: NCAT, PERRL, EOMi, OP clear, neck supple without masses PULM: Insp crackles in bases bilaterally L > R (minimal), some rhonchi in upper lobes CV: RRR, systolic murmur RUSB crecs/descr, no JVD AB: BS+, soft, nontender, no hsm Ext: warm, no edema, no clubbing, no cyanosis Derm: no rash or skin breakdown Neuro: A&Ox4, CN II-XII intact, strength 5/5 in all 4 extremities       Assessment & Plan:   No problem-specific assessment & plan notes found for this encounter.   Updated Medication List Outpatient Encounter Prescriptions as of 08/06/2011  Medication Sig Dispense Refill  . Ascorbic Acid (VITAMIN C) 1000 MG  tablet Take 1,000 mg by mouth daily.        [name] atorvastatin (LIPITOR) 20 MG tablet Take 1 tablet (20 mg total) by mouth daily.  90 tablet  3  . Biotin 1000 MCG tablet Take 1,000 mcg by mouth daily.        . budesonide (PULMICORT FLEXHALER) 180 MCG/ACT inhaler Inhale 1 puff into the lungs 2 (two) times daily. Start after symbicort runs out  1 each  6  . cholecalciferol (VITAMIN D) 1000 UNITS tablet Take 1,000 Units by mouth daily.        [name] losartan (COZAAR) 50 MG tablet Take 1 tablet (50 mg total) by mouth daily.  90 tablet  3  . mirtazapine (REMERON) 45 MG tablet Take 45 mg by mouth as needed.        [name] Respiratory Therapy Supplies (FLUTTER) DEVI Use 3-4 times daily  1 each  0

## 2011-08-08 NOTE — Progress Notes (Signed)
No show

## 2011-08-10 DIAGNOSIS — C44611 Basal cell carcinoma of skin of unspecified upper limb, including shoulder: Secondary | ICD-10-CM | POA: Diagnosis not present

## 2011-08-10 DIAGNOSIS — D485 Neoplasm of uncertain behavior of skin: Secondary | ICD-10-CM | POA: Diagnosis not present

## 2011-08-10 DIAGNOSIS — L570 Actinic keratosis: Secondary | ICD-10-CM | POA: Diagnosis not present

## 2011-08-10 DIAGNOSIS — Z85828 Personal history of other malignant neoplasm of skin: Secondary | ICD-10-CM | POA: Diagnosis not present

## 2011-09-06 ENCOUNTER — Telehealth: Payer: Self-pay | Admitting: Internal Medicine

## 2011-09-06 NOTE — Telephone Encounter (Signed)
Patient needing an appointment sooner for her physical and she wants to discuss a referral

## 2011-09-13 NOTE — Telephone Encounter (Signed)
Patient put in for a sooner appointment.

## 2011-10-05 ENCOUNTER — Telehealth: Payer: Self-pay | Admitting: Internal Medicine

## 2011-10-05 DIAGNOSIS — N390 Urinary tract infection, site not specified: Secondary | ICD-10-CM | POA: Diagnosis not present

## 2011-10-05 MED ORDER — SULFAMETHOXAZOLE-TMP DS 800-160 MG PO TABS: 1.0000 | ORAL_TABLET | Freq: Two times a day (BID) | ORAL | Status: DC

## 2011-10-05 NOTE — Telephone Encounter (Signed)
Fine to get UA and culture (fax order to facility). Would also call in Bactrim DS po bid x 7 days.

## 2011-10-05 NOTE — Telephone Encounter (Signed)
Patient notified via telephone as instructed, Rx sent to [facility] per patients request.  Order faxed to [facility] at [phone].

## 2011-10-05 NOTE — Telephone Encounter (Signed)
  Caller: Liesa/Patient; PCP: Ronna Polio; CB#: [phone]; Call regarding Urinary Pain/Bleeding.  Pain started with urgency and started 1 week ago and got worse last night. Pt is at the [facility]. Traiged U/A SX and last voided this AM.   All emergent SX R/O. Disp = needs to be seen in 24 hrs.  [facility]  Can she just get an order to get a U/C done, or does she need an appt.? If she can jsut have an order for U/C C&S please fax to, or if pt needs to be seen please fax to clinic nurse and she will tell pt.  FAX # [phone] to be sent to their health clinic  ATTN: [facility].

## 2011-10-08 ENCOUNTER — Telehealth: Payer: Self-pay | Admitting: Internal Medicine

## 2011-10-08 MED ORDER — CEPHALEXIN 500 MG PO CAPS: 500.0000 mg | ORAL_CAPSULE | Freq: Three times a day (TID) | ORAL | Status: DC

## 2011-10-08 NOTE — Telephone Encounter (Signed)
Patient advised as instructed via telephone, Rx for [facility]eflex [facility].

## 2011-10-08 NOTE — Telephone Encounter (Signed)
Urine culture showed E.coli which was resistant to Bactrim. Would like to change to Keflex 500mg  po tid x 7 days #21.

## 2011-10-10 DIAGNOSIS — L570 Actinic keratosis: Secondary | ICD-10-CM | POA: Diagnosis not present

## 2011-10-10 DIAGNOSIS — L723 Sebaceous cyst: Secondary | ICD-10-CM | POA: Diagnosis not present

## 2011-10-12 ENCOUNTER — Encounter: Payer: Self-pay | Admitting: Internal Medicine

## 2011-10-24 DIAGNOSIS — I10 Essential (primary) hypertension: Secondary | ICD-10-CM | POA: Diagnosis not present

## 2011-10-24 DIAGNOSIS — E785 Hyperlipidemia, unspecified: Secondary | ICD-10-CM | POA: Diagnosis not present

## 2011-10-30 ENCOUNTER — Telehealth: Payer: Self-pay | Admitting: Internal Medicine

## 2011-10-30 ENCOUNTER — Encounter: Payer: Self-pay | Admitting: Internal Medicine

## 2011-10-30 ENCOUNTER — Ambulatory Visit (INDEPENDENT_AMBULATORY_CARE_PROVIDER_SITE_OTHER): Payer: Medicare Other | Admitting: Internal Medicine

## 2011-10-30 VITALS — BP 124/70 | HR 68 | Temp 98.3°F | Ht 61.5 in | Wt 123.0 lb

## 2011-10-30 DIAGNOSIS — R011 Cardiac murmur, unspecified: Secondary | ICD-10-CM | POA: Diagnosis not present

## 2011-10-30 DIAGNOSIS — Z0000 Encounter for general adult medical examination without abnormal findings: Secondary | ICD-10-CM

## 2011-10-30 DIAGNOSIS — K580 Irritable bowel syndrome with diarrhea: Secondary | ICD-10-CM | POA: Insufficient documentation

## 2011-10-30 DIAGNOSIS — N8111 Cystocele, midline: Secondary | ICD-10-CM

## 2011-10-30 DIAGNOSIS — K589 Irritable bowel syndrome without diarrhea: Secondary | ICD-10-CM

## 2011-10-30 DIAGNOSIS — N8110 Cystocele, unspecified: Secondary | ICD-10-CM

## 2011-10-30 NOTE — Assessment & Plan Note (Signed)
Chronic. Recurrent after bladder tack in past. Will set up referral to urogynecology for further evaluation. Question if she might benefit from repeat surgery.

## 2011-10-30 NOTE — Progress Notes (Signed)
Subjective:    Patient ID: Maria Bass, female    DOB: 1929/11/08, 78 y.o.   MRN: [id number]  HPI The patient is here for annual Medicare wellness examination and management of other chronic and acute problems.  She has 2 concerns today. First, she notes a long history of bladder prolapse. She had surgical repair of this in the 90s with improvement. However, over the last several years she has had progressive prolapse of her bladder. She was seen by a local gynecologist to recommended use of pessary, however she was unable to find pessary that fit well and would stay intact. She is not currently using a pessary. She notes protrusion of her bladder with some irritation externally. She also had a recent urinary tract infection. She would like to see another specialist to see if she is a candidate for any additional intervention.  Her second concern today is long history of ear trouble bowel syndrome. She notes that a couple of times per week, she has urgent loose bowel movements after eating breakfast. She typically will have about 4 bowel movements before noon and then symptoms subside. She denies any blood in her stool, fever, chills. She has not been able to attribute symptoms to any particular foods. She has tried using probiotic such as align with no improvement. Prior to the bowel movement she will have some crampy abdominal pain but she does not have any continuous abdominal pain. She notes that her last colonoscopy was approximately 10 years ago.    The risk factors are reflected in the social history.  The roster of all physicians providing medical care to patient - is listed in the Snapshot section of the chart.  Activities of daily living:  The patient is 100% independent in all ADLs: dressing, toileting, feeding as well as independent mobility  Home safety : The patient has smoke detectors in the home. They wear seatbelts.  There are no firearms at home. There is no violence in the  home.   There is no risks for hepatitis, STDs or HIV. There is no history of blood transfusion. They have no travel history to infectious disease endemic areas of the world.  The patient has seen their dentist in the last six month (Dr. Lissa Hoard). They have seen their eye doctor in the last year (Dr. Haskel Khan).  They admit to slight hearing difficulty with regard to whispered voices and some television programs.  They have deferred audiologic testing in the last year.  Would like to defer for now.  They do not have excessive sun exposure. Discussed the need for sun protection: hats, long sleeves and use of sunscreen if there is significant sun exposure.   Diet: the importance of a healthy diet is discussed. They do have a healthy diet. Weight Watchers.  The benefits of regular aerobic exercise were discussed. She works out at [employer] typically 3-4 times per week.  Living Will - in place. HCPOA - Johny Shears.  Depression screen: there are no signs or vegative symptoms of depression- irritability, change in appetite, anhedonia, sadness/tearfullness.  Cognitive assessment: the patient manages all their financial and personal affairs and is actively engaged. They could relate day,date,year and events; recalled 2/3 objects at 3 minutes; performed clock-face test normally.  The following portions of the patient's history were reviewed and updated as appropriate: allergies, current medications, past family history, past medical history,  past surgical history, past social history  and problem list.  Visual acuity was not assessed per  patient preference since she has regular follow up with her ophthalmologist. Hearing and body mass index were assessed and reviewed.   During the course of the visit the patient was educated and counseled about appropriate screening and preventive services including : fall prevention , diabetes screening, nutrition counseling, colorectal cancer screening, and  recommended immunizations.     Outpatient Encounter Prescriptions as of 10/30/2011  Medication Sig Dispense Refill  . Ascorbic Acid (VITAMIN C) 1000 MG tablet Take 1,000 mg by mouth daily.        [name] atorvastatin (LIPITOR) 20 MG tablet Take 1 tablet (20 mg total) by mouth daily.  90 tablet  3  . Biotin 1000 MCG tablet Take 1,000 mcg by mouth daily.        . cholecalciferol (VITAMIN D) 1000 UNITS tablet Take 1,000 Units by mouth daily.        [name] losartan (COZAAR) 50 MG tablet Take 1 tablet (50 mg total) by mouth daily.  90 tablet  3  . mirtazapine (REMERON) 45 MG tablet Take 45 mg by mouth as needed.        . budesonide (PULMICORT FLEXHALER) 180 MCG/ACT inhaler Inhale 1 puff into the lungs 2 (two) times daily. Start after symbicort runs out  1 each  6  . Respiratory Therapy Supplies (FLUTTER) DEVI Use 3-4 times daily  1 each  0  . DISCONTD: cephALEXin (KEFLEX) 500 MG capsule Take 1 capsule (500 mg total) by mouth 3 (three) times daily.  21 capsule  0    Review of Systems  Constitutional: Negative for fever, chills, appetite change, fatigue and unexpected weight change.  HENT: Negative for ear pain, congestion, sore throat, trouble swallowing, neck pain, voice change and sinus pressure.   Eyes: Negative for visual disturbance.  Respiratory: Negative for cough, shortness of breath, wheezing and stridor.   Cardiovascular: Negative for chest pain, palpitations and leg swelling.  Gastrointestinal: Positive for abdominal pain and diarrhea. Negative for nausea, vomiting, constipation, blood in stool, abdominal distention and anal bleeding.  Genitourinary: Positive for difficulty urinating. Negative for dysuria, urgency, frequency, hematuria, flank pain and pelvic pain.  Musculoskeletal: Negative for myalgias, arthralgias and gait problem.  Skin: Negative for color change and rash.  Neurological: Negative for dizziness and headaches.  Hematological: Negative for adenopathy. Does not bruise/bleed easily.   Psychiatric/Behavioral: Negative for suicidal ideas, disturbed wake/sleep cycle and dysphoric mood. The patient is not nervous/anxious.    BP 124/70  Pulse 68  Temp 98.3 F (36.8 C) (Oral)  Ht 5' 1.5" (1.562 m)  Wt 123 lb (55.792 kg)  BMI 22.86 kg/m2  SpO2 97%     Objective:   Physical Exam  Constitutional: She is oriented to person, place, and time. She appears well-developed and well-nourished. No distress.  HENT:  Head: Normocephalic and atraumatic.  Right Ear: External ear normal.  Left Ear: External ear normal.  Nose: Nose normal.  Mouth/Throat: Oropharynx is clear and moist. No oropharyngeal exudate.  Eyes: Conjunctivae are normal. Pupils are equal, round, and reactive to light. Right eye exhibits no discharge. Left eye exhibits no discharge. No scleral icterus.  Neck: Normal range of motion. Neck supple. No tracheal deviation present. No thyromegaly present.  Cardiovascular: Normal rate, regular rhythm and intact distal pulses.  Exam reveals no gallop and no friction rub.   Murmur heard.  Systolic murmur is present with a grade of 2/6  Pulmonary/Chest: Effort normal and breath sounds normal. No accessory muscle usage. Not tachypneic. No respiratory distress.  She has no decreased breath sounds. She has no wheezes. She has no rhonchi. She has no rales. She exhibits no tenderness. Right breast exhibits no inverted nipple, no mass, no nipple discharge, no skin change and no tenderness. Left breast exhibits no inverted nipple, no mass, no nipple discharge, no skin change and no tenderness. Breasts are symmetrical.  Abdominal: Soft. Bowel sounds are normal. She exhibits no distension and no mass. There is no tenderness. There is no guarding.  Musculoskeletal: Normal range of motion. She exhibits no edema and no tenderness.  Lymphadenopathy:    She has no cervical adenopathy.  Neurological: She is alert and oriented to person, place, and time. No cranial nerve deficit. She exhibits  normal muscle tone. Coordination normal.  Skin: Skin is warm and dry. No rash noted. She is not diaphoretic. No erythema. No pallor.  Psychiatric: She has a normal mood and affect. Her behavior is normal. Judgment and thought content normal.          Assessment & Plan:

## 2011-10-30 NOTE — Assessment & Plan Note (Signed)
Symptoms are most consistent with irritable bowel syndrome. No symptoms or exam findings to suggest infectious etiology, acute diverticulitis. No benefit with probiotics. Patient is due for colonoscopy so we'll set up GI evaluation.

## 2011-10-30 NOTE — Assessment & Plan Note (Signed)
General exam normal today except as noted. Health maintenance is up-to-date except for colonoscopy which will be scheduled. Vaccinations are up-to-date. Recent lab work including blood counts, kidney function, liver function, and cholesterol was normal. Patient will followup in 3 months.

## 2011-10-30 NOTE — Telephone Encounter (Signed)
Lab work including blood counts kidney and liver function electrolytes and cholesterol were normal.

## 2011-10-30 NOTE — Telephone Encounter (Signed)
Left message on machine at home advising patient as instructed. 

## 2011-10-30 NOTE — Assessment & Plan Note (Signed)
Murmur most consistent with aortic stenosis. We discussed potentially getting an echo, but she would like to hold off for now given that she is asymptomatic. Will continue to monitor.

## 2011-10-31 ENCOUNTER — Telehealth: Payer: Self-pay | Admitting: Internal Medicine

## 2011-10-31 ENCOUNTER — Ambulatory Visit: Payer: Medicare Other | Admitting: Pulmonary Disease

## 2011-11-12 NOTE — Telephone Encounter (Signed)
Opened in error

## 2011-11-15 ENCOUNTER — Encounter: Payer: Self-pay | Admitting: Internal Medicine

## 2011-11-28 ENCOUNTER — Ambulatory Visit (INDEPENDENT_AMBULATORY_CARE_PROVIDER_SITE_OTHER): Payer: Medicare Other | Admitting: Pulmonary Disease

## 2011-11-28 ENCOUNTER — Encounter: Payer: Self-pay | Admitting: Pulmonary Disease

## 2011-11-28 VITALS — BP 112/64 | HR 73 | Temp 97.6°F | Ht 62.0 in | Wt 124.4 lb

## 2011-11-28 DIAGNOSIS — J309 Allergic rhinitis, unspecified: Secondary | ICD-10-CM | POA: Diagnosis not present

## 2011-11-28 DIAGNOSIS — J479 Bronchiectasis, uncomplicated: Secondary | ICD-10-CM | POA: Diagnosis not present

## 2011-11-28 MED ORDER — MOMETASONE FUROATE 50 MCG/ACT NA SUSP: 2.0000 | Freq: Every day | NASAL | Status: DC

## 2011-11-28 NOTE — Assessment & Plan Note (Signed)
This is been a stable interval for Maria Bass. She has no symptoms of increased or worsening bronchiectasis. She notes some thick phlegm in her throat over the last few days which I think is likely related to her sinuses see below.  Plan:  -followup sputum culture she provided [place] with yesterday (this should be used to guide therapy for respiratory infections) -Continue supportive valve a regular basis next -continue exercise regularly -see sinuses below

## 2011-11-28 NOTE — Assessment & Plan Note (Signed)
Maria Bass describes phlegm in her throat, frequent sneezing, and some sinus drainage which consistent with allergic rhinitis. She has never used a nasal steroid or any other sinus medications for that matter.  Plan: -Start Nasonex 2 puffs each married daily -Maria Bass med rinses -Chlor-Trimeton and Sudafed if needed.

## 2011-11-28 NOTE — Progress Notes (Signed)
Subjective:    Patient ID: Maria Bass, female    DOB: 1929-07-28, 78 y.o.   MRN: [id number]  Synopsis: Is a very pleasant patient with bronchiectasis who first saw [place] at the [facility] pulmonary clinic in January 2013. She previously been followed by doctors Francis Dowse. and Sandy Salaam for the same. She underwent antibiotic treatment for MAI in 2003 through 2005 as well as in 2006.  HPI  11/28/2011 routine office visit--Mrs. Maria Bass states that she's been doing quite well lately. She continues to exercise on a regular basis including aerobics. She has minimal cough and only experiences shortness of breath on extreme exertion. Her weight has been stable she's not been having fevers or chills. She has noted a lot of sneezing lately with some sinus congestion. She states that she's had a thick phlegm in her throat which is difficult to clear. She has not had increased wheezing or shortness of breath associated with this.  Past Medical History  Diagnosis Date  . Hypertension   . Hyperlipidemia   . MAI (mycobacterium avium-intracellulare) 2003  . Depression   . IBS (irritable bowel syndrome)      Review of Systems  Constitutional: Negative for fever, chills and unexpected weight change.  HENT: Positive for congestion, sneezing and postnasal drip. Negative for nosebleeds, rhinorrhea and sinus pressure.   Respiratory: Negative for cough, choking and shortness of breath.   Cardiovascular: Negative for chest pain and leg swelling.       Objective:   Physical Exam   Filed Vitals:   11/28/11 1340  BP: 112/64  Pulse: 73  Temp: 97.6 F (36.4 C)  TempSrc: Oral  Height: 5\' 2"  (1.575 m)  Weight: 124 lb 6.4 oz (56.427 kg)  SpO2: 95%   Gen: well appearing, no acute distress HEENT: NCAT, PERRL, EOMi, OP clear, neck supple without masses PULM: Insp crackles in bases bilaterally L > R (minimal), some rhonchi in upper lobes CV: RRR, systolic murmur RUSB crecs/descr, no JVD AB:  BS+, soft, nontender, no hsm Ext: warm, no edema, no clubbing, no cyanosis      Assessment & Plan:   Bronchiectasis This is been a stable interval for [name]. She has no symptoms of increased or worsening bronchiectasis. She notes some thick phlegm in her throat over the last few days which I think is likely related to her sinuses see below.  Plan:  -followup sputum culture she provided [place] with yesterday (this should be used to guide therapy for respiratory infections) -Continue supportive valve a regular basis next -continue exercise regularly -see sinuses below  Allergic rhinitis [name] describes phlegm in her throat, frequent sneezing, and some sinus drainage which consistent with allergic rhinitis. She has never used a nasal steroid or any other sinus medications for that matter.  Plan: -Start Nasonex 2 puffs each married daily -Lloyd Huger med rinses -Chlor-Trimeton and Sudafed if needed.    Updated Medication List Outpatient Encounter Prescriptions as of 11/28/2011  Medication Sig Dispense Refill  . Ascorbic Acid (VITAMIN C) 1000 MG tablet Take 1,000 mg by mouth daily.        [name] atorvastatin (LIPITOR) 20 MG tablet Take 1 tablet (20 mg total) by mouth daily.  90 tablet  3  . Biotin 1000 MCG tablet Take 5,000 mcg by mouth daily.       . cholecalciferol (VITAMIN D) 1000 UNITS tablet Take 1,000 Units by mouth daily.        [name] losartan (COZAAR) 50 MG tablet Take  1 tablet (50 mg total) by mouth daily.  90 tablet  3  . mirtazapine (REMERON) 45 MG tablet Take 45 mg by mouth as needed.        [name] DISCONTD: budesonide (PULMICORT FLEXHALER) 180 MCG/ACT inhaler Inhale 1 puff into the lungs 2 (two) times daily. Start after symbicort runs out  1 each  6  . DISCONTD: Respiratory Therapy Supplies (FLUTTER) DEVI Use 3-4 times daily  1 each  0

## 2011-11-28 NOTE — Patient Instructions (Signed)
Use Neil Med rinses with distilled water at least twice per day using the instructions on the package. 1/2 hour after using the [facility] Med rinse, use Nasonex two puffs in each nostril once per day. If this doesn't work after three or four weeks then use chlortrimeton and an over the counter decongestant (ask the pharmacist for a recommendation) as needed for the cough.  We will see you back in one year or sooner if needed.

## 2011-11-29 ENCOUNTER — Other Ambulatory Visit: Payer: Self-pay | Admitting: *Deleted

## 2011-11-29 ENCOUNTER — Encounter: Payer: Self-pay | Admitting: Internal Medicine

## 2011-11-29 DIAGNOSIS — J479 Bronchiectasis, uncomplicated: Secondary | ICD-10-CM | POA: Diagnosis not present

## 2011-11-29 MED ORDER — MIRTAZAPINE 45 MG PO TABS: 45.0000 mg | ORAL_TABLET | ORAL | Status: DC | PRN

## 2011-11-29 NOTE — Telephone Encounter (Signed)
Patients spouse advised via telephone, Rx ready for pick up.

## 2011-12-02 LAB — RESPIRATORY CULTURE OR RESPIRATORY AND SPUTUM CULTURE
Gram Stain: NONE SEEN
Gram Stain: NONE SEEN
Organism ID, Bacteria: NORMAL

## 2011-12-03 ENCOUNTER — Encounter: Payer: Medicare Other | Admitting: Internal Medicine

## 2011-12-06 ENCOUNTER — Telehealth: Payer: Self-pay | Admitting: Pulmonary Disease

## 2011-12-06 ENCOUNTER — Encounter: Payer: Self-pay | Admitting: Internal Medicine

## 2011-12-06 NOTE — Telephone Encounter (Signed)
I spoke with pt and made her aware Dr. Kendrick Fries sent this on 11/29/11. I confirmed with [facility] they did have rx. Nothing further was needed

## 2011-12-10 ENCOUNTER — Ambulatory Visit (INDEPENDENT_AMBULATORY_CARE_PROVIDER_SITE_OTHER): Payer: Medicare Other | Admitting: Internal Medicine

## 2011-12-10 ENCOUNTER — Encounter: Payer: Self-pay | Admitting: Internal Medicine

## 2011-12-10 ENCOUNTER — Other Ambulatory Visit (INDEPENDENT_AMBULATORY_CARE_PROVIDER_SITE_OTHER): Payer: Medicare Other

## 2011-12-10 VITALS — BP 102/62 | HR 80 | Ht 61.5 in | Wt 123.5 lb

## 2011-12-10 DIAGNOSIS — R194 Change in bowel habit: Secondary | ICD-10-CM

## 2011-12-10 DIAGNOSIS — R1030 Lower abdominal pain, unspecified: Secondary | ICD-10-CM

## 2011-12-10 DIAGNOSIS — R195 Other fecal abnormalities: Secondary | ICD-10-CM

## 2011-12-10 DIAGNOSIS — R198 Other specified symptoms and signs involving the digestive system and abdomen: Secondary | ICD-10-CM

## 2011-12-10 DIAGNOSIS — R109 Unspecified abdominal pain: Secondary | ICD-10-CM

## 2011-12-10 MED ORDER — HYOSCYAMINE SULFATE 0.125 MG SL SUBL: 0.1250 mg | SUBLINGUAL_TABLET | SUBLINGUAL | Status: DC | PRN

## 2011-12-10 MED ORDER — LOPERAMIDE HCL 2 MG PO CAPS: 2.0000 mg | ORAL_CAPSULE | Freq: Four times a day (QID) | ORAL | Status: AC | PRN

## 2011-12-10 MED ORDER — PEG-KCL-NACL-NASULF-NA ASC-C 100 G PO SOLR: 1.0000 | Freq: Once | ORAL | Status: DC

## 2011-12-10 NOTE — Progress Notes (Signed)
Patient ID: Maria Bass, female   DOB: 11-14-1929, 78 y.o.   MRN: [id number]  SUBJECTIVE: HPI Maria Bass is a 78 yo female with PMH of hypertension, hyperlipidemia, IBS, and MAI pulmonary infection who is seen in consultation at the request of Dr. Dan Humphreys for evaluation of intermittent diarrhea and change in bowel habit. The patient states that her intermittent diarrhea and lower abdominal cramping are long-term issue for her dating back to around 2006. She states that her symptoms overall have gotten worse. She reports that she has good and bad days, and usually when she is having bad days they string together 4-5 days consecutively.  For her she reports intermittent loose stools in the morning associated with abdominal cramping. On bad days she reports she develops loose stools with cramping after breakfast. She will have 5-6 loose or watery stools between breakfast and lunch associated with significant lower abdominal cramping. She will often feel tired and worn out on these days. On the other days she reports one to 2 soft but formed stools after breakfast without cramping or diarrhea. She denies blood in her stools and melena. She has noted increased lower GI gas. She's had no fever or chills. He said no upper symptoms including no nausea or vomiting. No heartburn. Appetite is good. She is aware of no trigger for her symptoms. She reports the last time she experienced bad days was late in June 2013. She recalls her last colonoscopy was 9 years ago. She has occasionally use loperamide for her diarrhea and reports improvement when she uses it. No new medications around the time of the symptoms worsening  Review of Systems  As per history of present illness, otherwise negative   Past Medical History  Diagnosis Date  . Hypertension   . Hyperlipidemia   . MAI (mycobacterium avium-intracellulare) 2003  . Depression   . IBS (irritable bowel syndrome)     Current Outpatient Prescriptions    Medication Sig Dispense Refill  . Ascorbic Acid (VITAMIN C) 1000 MG tablet Take 1,000 mg by mouth daily.        [name] atorvastatin (LIPITOR) 20 MG tablet Take 1 tablet (20 mg total) by mouth daily.  90 tablet  3  . Biotin 1000 MCG tablet Take 5,000 mcg by mouth daily.       . cholecalciferol (VITAMIN D) 1000 UNITS tablet Take 1,000 Units by mouth daily.        [name] losartan (COZAAR) 50 MG tablet Take 1 tablet (50 mg total) by mouth daily.  90 tablet  3  . mirtazapine (REMERON) 45 MG tablet Take 1 tablet (45 mg total) by mouth as needed.  90 tablet  3  . hyoscyamine (LEVSIN SL) 0.125 MG SL tablet Place 1 tablet (0.125 mg total) under the tongue every 4 (four) hours as needed for cramping.  30 tablet  0  . loperamide (IMODIUM) 2 MG capsule Take 1 capsule (2 mg total) by mouth 4 (four) times daily as needed for diarrhea or loose stools.  30 capsule  0  . mometasone (NASONEX) 50 MCG/ACT nasal spray Place 2 sprays into the nose daily.  17 g  2  . peg 3350 powder (MOVIPREP) 100 G SOLR Take 1 kit (100 g total) by mouth once.  1 kit  0    No Known Allergies  Family History  Problem Relation Age of Onset  . Heart attack Mother   . Heart attack Father     History  Substance Use Topics  .  Smoking status: Former Smoker    Types: Cigarettes    Quit date: 04/23/1978  . Smokeless tobacco: Never Used   Comment: social smoker x 15  . Alcohol Use: Yes     glass a wine at night    OBJECTIVE: BP 102/62  Pulse 80  Ht 5' 1.5" (1.562 m)  Wt 123 lb 8 oz (56.019 kg)  BMI 22.96 kg/m2 Constitutional: Well-developed and well-nourished. No distress. HEENT: Normocephalic and atraumatic. Oropharynx is clear and moist. No oropharyngeal exudate. Conjunctivae are normal. Pupils are equal round and reactive to light. No scleral icterus. Neck: Neck supple. Trachea midline. Cardiovascular: Normal rate, regular rhythm and intact distal pulses. No M/R/G Pulmonary/chest: Effort normal and breath sounds normal. No  wheezing, rales or rhonchi. Abdominal: Soft, nontender, nondistended. Bowel sounds active throughout. There are no masses palpable. No hepatosplenomegaly. Extremities: no clubbing, cyanosis, or edema Lymphadenopathy: No cervical adenopathy noted. Neurological: Alert and oriented to person place and time. Skin: Skin is warm and dry. No rashes noted. Psychiatric: Normal mood and affect. Behavior is normal.  Labs and Imaging -- CBC, CMP, and lipid panel were unremarkable and reviewed under the lab tab (scanned document from [facility])  ASSESSMENT AND PLAN: 78 yo female with PMH of hypertension, hyperlipidemia, IBS, and MAI pulmonary infection who is seen in consultation at the request of Dr. Dan Humphreys for evaluation of intermittent diarrhea and change in bowel habit  1. Intermittent loose stools/lower abd cramping -- her symptoms are intermittent which argues against an inflammatory or infectious etiology. I do think given that it has been over 9 year since her last colonoscopy, that repeating a colonoscopy for further evaluation and screening is reasonable. We discussed this test and how usually stops around age 45 in regards to colorectal cancer screening. 4 CRC screening, this will likely be her last colonoscopy. She is agreeable to proceed. I will also check a thyroid function panel, and celiac panel today. We have discussed keeping a food diary to try and determine if any foods trigger her episodes, such as lactose. I will prescribe Levsin to be used as directed and as needed for lower abdominal cramping, and I've advised that she use loperamide per box instructions to try to curb her diarrhea on her "bad days". She has a history of hysterectomy and bladder tacking, and has been a coming appointment with a uro-gynecologist.  We have discussed how her episodes made be related to her previous pelvic surgery, in the form of adhesive disease, though this is felt less likely at present. Further  recommendations to be made after the trial of the above medications and after the data is gathered from labs and colonoscopy.

## 2011-12-10 NOTE — Patient Instructions (Addendum)
You have been scheduled for a colonoscopy with propofol. Please follow written instructions given to you at your visit today.  Please pick up your prep kit at the pharmacy within the next 1-3 days. If you use inhalers (even only as needed), please bring them with you on the day of your procedure.  Your physician has requested that you go to the basement for the following lab work before leaving today: Celiac Panel, TSH    We have sent the following medications to your pharmacy for you to pick up at your convenience:levsin, imodium, moviprep, please take all as directed.  Dr. Rhea Belton would like you to start keeping a food diary.     Follow up with Dr. Rhea Belton after Colonoscopy

## 2011-12-19 DIAGNOSIS — N952 Postmenopausal atrophic vaginitis: Secondary | ICD-10-CM | POA: Diagnosis not present

## 2011-12-19 DIAGNOSIS — N8110 Cystocele, unspecified: Secondary | ICD-10-CM | POA: Diagnosis not present

## 2012-01-28 ENCOUNTER — Encounter: Payer: Self-pay | Admitting: Internal Medicine

## 2012-01-28 ENCOUNTER — Ambulatory Visit (AMBULATORY_SURGERY_CENTER): Payer: Medicare Other | Admitting: Internal Medicine

## 2012-01-28 VITALS — BP 125/57 | HR 61 | Temp 97.8°F | Resp 12 | Ht 61.0 in | Wt 123.0 lb

## 2012-01-28 DIAGNOSIS — I10 Essential (primary) hypertension: Secondary | ICD-10-CM | POA: Diagnosis not present

## 2012-01-28 DIAGNOSIS — R198 Other specified symptoms and signs involving the digestive system and abdomen: Secondary | ICD-10-CM

## 2012-01-28 DIAGNOSIS — K620 Anal polyp: Secondary | ICD-10-CM | POA: Diagnosis not present

## 2012-01-28 DIAGNOSIS — D126 Benign neoplasm of colon, unspecified: Secondary | ICD-10-CM | POA: Diagnosis not present

## 2012-01-28 DIAGNOSIS — R194 Change in bowel habit: Secondary | ICD-10-CM

## 2012-01-28 DIAGNOSIS — J479 Bronchiectasis, uncomplicated: Secondary | ICD-10-CM | POA: Diagnosis not present

## 2012-01-28 DIAGNOSIS — R109 Unspecified abdominal pain: Secondary | ICD-10-CM

## 2012-01-28 DIAGNOSIS — K589 Irritable bowel syndrome without diarrhea: Secondary | ICD-10-CM | POA: Diagnosis not present

## 2012-01-28 DIAGNOSIS — K621 Rectal polyp: Secondary | ICD-10-CM

## 2012-01-28 DIAGNOSIS — E785 Hyperlipidemia, unspecified: Secondary | ICD-10-CM | POA: Diagnosis not present

## 2012-01-28 DIAGNOSIS — K580 Irritable bowel syndrome with diarrhea: Secondary | ICD-10-CM

## 2012-01-28 MED ORDER — SODIUM CHLORIDE 0.9 % IV SOLN: 500.0000 mL | INTRAVENOUS | Status: DC

## 2012-01-28 NOTE — Progress Notes (Signed)
Patient did not experience any of the following events: a burn prior to discharge; a fall within the facility; wrong site/side/patient/procedure/implant event; or a hospital transfer or hospital admission upon discharge from the facility. (G8907) Patient did not have preoperative order for IV antibiotic SSI prophylaxis. (G8918)  

## 2012-01-28 NOTE — Patient Instructions (Addendum)

## 2012-01-28 NOTE — Op Note (Signed)
[facility] [address] [place], [address]   COLONOSCOPY PROCEDURE REPORT  PATIENT: Maria Bass, Maria Bass.  MR#: [id number] BIRTHDATE: 09-22-1929 , 82  yrs. old GENDER: Female ENDOSCOPIST: Beverley Fiedler, MD REFERRED NF:[id number], Jennifer PROCEDURE DATE:  01/28/2012 PROCEDURE:   Colonoscopy with biopsy and Colonoscopy with cold biopsy polypectomy ASA CLASS:   Class III INDICATIONS:change in bowel habits and unexplained diarrhea. MEDICATIONS: MAC sedation, administered by CRNA and propofol (Diprivan) 150mg  IV  DESCRIPTION OF PROCEDURE:   After the risks benefits and alternatives of the procedure were thoroughly explained, informed consent was obtained.  A digital rectal exam revealed no rectal mass.   The LB [id number]-[id number]  endoscope was introduced through the anus and advanced to the cecum, which was identified by both the appendix and ileocecal valve. No adverse events experienced. The quality of the prep was good, using MoviPrep  The instrument was then slowly withdrawn as the colon was fully examined.   COLON FINDINGS: There was moderate diverticulosis noted in the sigmoid colon with associated tortuosity.   Two sessile polyps ranging between 3-61mm in size were found in the rectum. Polypectomy was performed with cold forceps.  All resections were complete and all polyp tissue was completely retrieved.   The colonic mucosa appeared normal throughout the entire examined colon.  Multiple random biopsies of the area were performed. Retroflexed views revealed no abnormalities. The time to cecum=10 minutes 04 seconds.  Withdrawal time=7 minutes 54 seconds.  The scope was withdrawn and the procedure completed. COMPLICATIONS: There were no complications.  ENDOSCOPIC IMPRESSION: 1.   There was moderate diverticulosis noted in the sigmoid colon 2.   Two sessile polyps ranging between 3-36mm in size were found in the rectum; Polypectomy was performed with cold  forceps 3.   The colonic mucosa appeared normal throughout the entire examined colon; multiple random biopsies of the area were performed   RECOMMENDATIONS: 1.  Await pathology results 2.  High fiber diet 3.  Given your age, you will not need another colonoscopy for colon cancer screening or polyp surveillance.  These types of tests usually stop around the age 81.  eSigned:  Beverley Fiedler, MD 01/28/2012 9:24 AM  cc: Ronna Polio MD and The Patient

## 2012-01-29 ENCOUNTER — Telehealth: Payer: Self-pay | Admitting: *Deleted

## 2012-01-29 NOTE — Telephone Encounter (Signed)
  Follow up Call-  Call back number 01/28/2012  Post procedure Call Back phone  # [phone]  Permission to leave phone message Yes     Patient questions:  Do you have a fever, pain , or abdominal swelling? no Pain Score  0 *  Have you tolerated food without any problems? yes  Have you been able to return to your normal activities? yes  Do you have any questions about your discharge instructions: Diet   no Medications  no Follow up visit  no  Do you have questions or concerns about your Care? no  Actions: * If pain score is 4 or above: No action needed, pain <4.

## 2012-02-01 ENCOUNTER — Encounter: Payer: Self-pay | Admitting: Internal Medicine

## 2012-02-08 DIAGNOSIS — L570 Actinic keratosis: Secondary | ICD-10-CM | POA: Diagnosis not present

## 2012-02-08 DIAGNOSIS — Z85828 Personal history of other malignant neoplasm of skin: Secondary | ICD-10-CM | POA: Diagnosis not present

## 2012-03-05 ENCOUNTER — Ambulatory Visit: Payer: Self-pay | Admitting: Internal Medicine

## 2012-03-05 DIAGNOSIS — Z1231 Encounter for screening mammogram for malignant neoplasm of breast: Secondary | ICD-10-CM | POA: Diagnosis not present

## 2012-03-13 ENCOUNTER — Encounter: Payer: Self-pay | Admitting: Internal Medicine

## 2012-03-26 ENCOUNTER — Other Ambulatory Visit: Payer: Self-pay | Admitting: Internal Medicine

## 2012-03-27 NOTE — Telephone Encounter (Signed)
Med filled.  

## 2012-04-28 ENCOUNTER — Ambulatory Visit (INDEPENDENT_AMBULATORY_CARE_PROVIDER_SITE_OTHER): Payer: Medicare Other | Admitting: Internal Medicine

## 2012-04-28 ENCOUNTER — Encounter: Payer: Self-pay | Admitting: Internal Medicine

## 2012-04-28 VITALS — BP 136/80 | HR 66 | Temp 98.4°F | Ht 61.5 in | Wt 125.8 lb

## 2012-04-28 DIAGNOSIS — N8111 Cystocele, midline: Secondary | ICD-10-CM

## 2012-04-28 DIAGNOSIS — I10 Essential (primary) hypertension: Secondary | ICD-10-CM

## 2012-04-28 DIAGNOSIS — K589 Irritable bowel syndrome without diarrhea: Secondary | ICD-10-CM

## 2012-04-28 DIAGNOSIS — E785 Hyperlipidemia, unspecified: Secondary | ICD-10-CM | POA: Diagnosis not present

## 2012-04-28 DIAGNOSIS — K580 Irritable bowel syndrome with diarrhea: Secondary | ICD-10-CM

## 2012-04-28 DIAGNOSIS — N8110 Cystocele, unspecified: Secondary | ICD-10-CM

## 2012-04-28 MED ORDER — LOPERAMIDE HCL 2 MG PO TABS: 2.0000 mg | ORAL_TABLET | Freq: Four times a day (QID) | ORAL | Status: DC | PRN

## 2012-04-28 MED ORDER — HYOSCYAMINE SULFATE 0.125 MG SL SUBL: 0.1250 mg | SUBLINGUAL_TABLET | SUBLINGUAL | Status: DC | PRN

## 2012-04-28 NOTE — Progress Notes (Signed)
Subjective:    Patient ID: Maria Bass, female    DOB: 12-26-1929, 79 y.o.   MRN: [id number]  HPI 79 year old female with history of bronchiectasis, hypertension, vaginal prolapse presents for followup. She reports she is generally doing well. She notes she was recently seen by urogynecology who recommended urodynamic testing and possible surgical intervention to correct prolapse bladder. She would prefer to hold off on this for now. She denies having any symptoms of pain, urinary incontinence.  She also notes she was recently seen by GI physician and had colonoscopy which was normal. She was prescribed loperamide hyoscyamine for some intermittent diarrhea and abdominal cramping. She reports improvement in her symptoms with this medication.  Outpatient Encounter Prescriptions as of 04/28/2012  Medication Sig Dispense Refill  . Ascorbic Acid (VITAMIN C) 1000 MG tablet Take 1,000 mg by mouth daily.        [name] atorvastatin (LIPITOR) 20 MG tablet Take 1 tablet (20 mg total) by mouth daily.  90 tablet  3  . Biotin 1000 MCG tablet Take 5,000 mcg by mouth daily.       . cholecalciferol (VITAMIN D) 1000 UNITS tablet Take 1,000 Units by mouth daily.        [name] conjugated estrogens (PREMARIN) vaginal cream Place 0.5 g vaginally twice a week.      . losartan (COZAAR) 50 MG tablet TAKE 1 TABLET DAILY  90 tablet  3  . mirtazapine (REMERON) 45 MG tablet Take 1 tablet (45 mg total) by mouth as needed.  90 tablet  3  . Probiotic Product (PROBIOTIC DAILY PO) Take 1 tablet by mouth daily.      . hyoscyamine (LEVSIN SL) 0.125 MG SL tablet Place 1 tablet (0.125 mg total) under the tongue every 4 (four) hours as needed for cramping.  30 tablet  0  . loperamide (IMODIUM A-D) 2 MG tablet Take 1 tablet (2 mg total) by mouth 4 (four) times daily as needed for diarrhea or loose stools.  30 tablet  0  . mometasone (NASONEX) 50 MCG/ACT nasal spray Place 2 sprays into the nose daily.  17 g  2  . [DISCONTINUED] hyoscyamine  (LEVSIN SL) 0.125 MG SL tablet Place 1 tablet (0.125 mg total) under the tongue every 4 (four) hours as needed for cramping.  30 tablet  0   BP 136/80  Pulse 66  Temp 98.4 F (36.9 C) (Oral)  Ht 5' 1.5" (1.562 m)  Wt 125 lb 12 oz (57.04 kg)  BMI 23.38 kg/m2  SpO2 97%  Review of Systems  Constitutional: Negative for fever, chills, appetite change, fatigue and unexpected weight change.  HENT: Negative for ear pain, congestion, sore throat, trouble swallowing, neck pain, voice change and sinus pressure.   Eyes: Negative for visual disturbance.  Respiratory: Negative for cough, shortness of breath, wheezing and stridor.   Cardiovascular: Negative for chest pain, palpitations and leg swelling.  Gastrointestinal: Negative for nausea, vomiting, abdominal pain, diarrhea, constipation, blood in stool, abdominal distention and anal bleeding.  Genitourinary: Negative for dysuria and flank pain.  Musculoskeletal: Negative for myalgias, arthralgias and gait problem.  Skin: Negative for color change and rash.  Neurological: Negative for dizziness and headaches.  Hematological: Negative for adenopathy. Does not bruise/bleed easily.  Psychiatric/Behavioral: Negative for suicidal ideas, sleep disturbance and dysphoric mood. The patient is not nervous/anxious.        Objective:   Physical Exam  Constitutional: She is oriented to person, place, and time. She appears well-developed and well-nourished.  No distress.  HENT:  Head: Normocephalic and atraumatic.  Right Ear: External ear normal.  Left Ear: External ear normal.  Nose: Nose normal.  Mouth/Throat: Oropharynx is clear and moist.  Eyes: Conjunctivae normal are normal. Pupils are equal, round, and reactive to light. Right eye exhibits no discharge. Left eye exhibits no discharge. No scleral icterus.  Neck: Normal range of motion. Neck supple. No tracheal deviation present. No thyromegaly present.  Cardiovascular: Normal rate, regular rhythm,  normal heart sounds and intact distal pulses.  Exam reveals no gallop and no friction rub.   No murmur heard. Pulmonary/Chest: Effort normal and breath sounds normal. No respiratory distress. She has no wheezes. She has no rales. She exhibits no tenderness.  Musculoskeletal: Normal range of motion. She exhibits no edema and no tenderness.  Lymphadenopathy:    She has no cervical adenopathy.  Neurological: She is alert and oriented to person, place, and time. No cranial nerve deficit. She exhibits normal muscle tone. Coordination normal.  Skin: Skin is warm and dry. No rash noted. She is not diaphoretic. No erythema. No pallor.  Psychiatric: She has a normal mood and affect. Her behavior is normal. Judgment and thought content normal.          Assessment & Plan:

## 2012-04-28 NOTE — Assessment & Plan Note (Addendum)
Patient was evaluated by urogynecology. Recommended urodynamic testing and possible surgical intervention. We discussed the potential benefits and risk of this today. Patient would prefer to hold off for now on any additional testing or intervention. Will continue to monitor.  >37min spent in face-to-face discussion related to recent evaluation and upcoming plans for management

## 2012-04-28 NOTE — Assessment & Plan Note (Signed)
Symptoms currently well-controlled with loperamide and hyoscyamine. Recent colonoscopy was normal. Will continue to monitor.

## 2012-07-02 ENCOUNTER — Other Ambulatory Visit: Payer: Self-pay | Admitting: *Deleted

## 2012-07-02 MED ORDER — ATORVASTATIN CALCIUM 20 MG PO TABS: 20.0000 mg | ORAL_TABLET | Freq: Every day | ORAL | Status: DC

## 2012-07-02 NOTE — Telephone Encounter (Signed)
Eprescribed.

## 2012-07-09 ENCOUNTER — Other Ambulatory Visit: Payer: Self-pay | Admitting: *Deleted

## 2012-07-09 ENCOUNTER — Other Ambulatory Visit: Payer: Self-pay | Admitting: Internal Medicine

## 2012-07-09 MED ORDER — ATORVASTATIN CALCIUM 20 MG PO TABS: 20.0000 mg | ORAL_TABLET | Freq: Every day | ORAL | Status: DC

## 2012-07-09 NOTE — Telephone Encounter (Signed)
Patient left message on voicemail requesting refill on Lipitor, would like the Rx mailed to home address on file. Rx printed to be signed and mailed to patient home per request.

## 2012-07-16 ENCOUNTER — Encounter: Payer: Self-pay | Admitting: Adult Health

## 2012-07-16 ENCOUNTER — Ambulatory Visit (INDEPENDENT_AMBULATORY_CARE_PROVIDER_SITE_OTHER): Payer: Medicare Other | Admitting: Adult Health

## 2012-07-16 VITALS — BP 104/63 | HR 66 | Temp 98.0°F | Resp 14 | Ht 62.0 in | Wt 126.0 lb

## 2012-07-16 DIAGNOSIS — J309 Allergic rhinitis, unspecified: Secondary | ICD-10-CM | POA: Diagnosis not present

## 2012-07-16 NOTE — Progress Notes (Signed)
  Subjective:    Patient ID: Maria Bass, female    DOB: 12-Feb-1930, 79 y.o.   MRN: [id number]  HPI  Patient presents to clinic with the following concerns:  1) Heavy mucus from sinuses - Dr. Kendrick Fries recommended Nasonex and sinus rinse - She has not started this.  She reports the following symptoms are year round - sneezing "violently", throat with thick mucus very uncomfortable, difficult to clear, rhinorrhea especially in a am, stuffy nose. Patient does not feel sick. These symptoms are more bothersome and uncomfortable. She denies fever, chills,   Current Outpatient Prescriptions on File Prior to Visit  Medication Sig Dispense Refill  . Ascorbic Acid (VITAMIN C) 1000 MG tablet Take 1,000 mg by mouth daily.        [name] atorvastatin (LIPITOR) 20 MG tablet Take 1 tablet (20 mg total) by mouth daily.  90 tablet  1  . atorvastatin (LIPITOR) 20 MG tablet TAKE 1 TABLET DAILY  90 tablet  3  . Biotin 1000 MCG tablet Take 5,000 mcg by mouth daily.       . cholecalciferol (VITAMIN D) 1000 UNITS tablet Take 1,000 Units by mouth daily.        [name] loperamide (IMODIUM A-D) 2 MG tablet Take 1 tablet (2 mg total) by mouth 4 (four) times daily as needed for diarrhea or loose stools.  30 tablet  0  . losartan (COZAAR) 50 MG tablet TAKE 1 TABLET DAILY  90 tablet  3  . mirtazapine (REMERON) 45 MG tablet Take 1 tablet (45 mg total) by mouth as needed.  90 tablet  3  . Probiotic Product (PROBIOTIC DAILY PO) Take 1 tablet by mouth daily.      [name] conjugated estrogens (PREMARIN) vaginal cream Place 0.5 g vaginally twice a week.      . hyoscyamine (LEVSIN SL) 0.125 MG SL tablet Place 1 tablet (0.125 mg total) under the tongue every 4 (four) hours as needed for cramping.  30 tablet  0  . mometasone (NASONEX) 50 MCG/ACT nasal spray Place 2 sprays into the nose daily.  17 g  2   No current facility-administered medications on file prior to visit.     Review of Systems  Constitutional: Negative.   HENT: Positive  for sneezing and postnasal drip. Negative for congestion.        Thick drainage mainly in the morning.  Eyes: Negative.   Respiratory: Negative for cough, chest tightness, shortness of breath and wheezing.   Cardiovascular: Negative for chest pain.  Neurological: Negative for dizziness, light-headedness and headaches.        Objective:   Physical Exam  Constitutional: She is oriented to person, place, and time. She appears well-developed and well-nourished. No distress.  HENT:  Head: Normocephalic and atraumatic.  Right Ear: External ear normal.  Left Ear: External ear normal.  Mouth/Throat: Oropharynx is clear and moist.  Cardiovascular: Normal rate, regular rhythm and normal heart sounds.   Pulmonary/Chest: Effort normal and breath sounds normal. No respiratory distress. She has no wheezes. She has no rales.  Lymphadenopathy:    She has no cervical adenopathy.  Neurological: She is oriented to person, place, and time.  Skin: Skin is warm and dry.  Psychiatric: She has a normal mood and affect. Her behavior is normal. Judgment and thought content normal.          Assessment & Plan:

## 2012-07-16 NOTE — Assessment & Plan Note (Signed)
Symptoms very likely from multiple sources. Recommend to start a sinus wash daily as well as the Nasonex nasal spray 2 sprays in each nostril at bedtime. If symptoms are not alleviated within one month, recommend referral to ENT. She may also benefit from seeing an Allergy Specialist.

## 2012-07-16 NOTE — Patient Instructions (Addendum)
For your sinus symptoms:  Start with daily nasal rinse.  Use the Nasonex - 2 sprays in each nostril daily. Preferably at bedtime.  If your symptoms are not improved within one month, we can refer you to a ENT specialist.   Below is information you may find helpful about inflammation of the sinuses:  What is sinusitis? - Sinusitis is a condition that can cause a stuffy nose, pain in the face, and yellow or green discharge (mucus) from the nose. The sinuses are hollow areas in the bones of the face. They have a thin lining that normally makes a small amount of mucus. When this lining gets infected, it swells and makes extra mucus. This causes symptoms.   Sinusitis can occur when a person gets sick with a cold. The germs causing the cold can also infect the sinuses. Many times, a person feels like his or her cold is getting better. But then he or she gets sinusitis and begins to feel sick again.  What are the symptoms of sinusitis? - Common symptoms of sinusitis include: Stuffy or blocked nose  Thick yellow or green discharge from the nose  Pain in the teeth  Pain or pressure in the face - This often feels worse when a person bends forward.   People with sinusitis can also have other symptoms that include: Fever  Cough  Trouble smelling  Ear pressure or fullness  Headache  Bad breath  Feeling tired   Most of the time, symptoms start to improve in 7 to 10 days.  Should I see a doctor or nurse? - See your doctor or nurse if your symptoms last more than 7 days, or if your symptoms get better at first but then get worse. Sometimes, sinusitis can lead to serious problems. See your doctor or nurse right away (do not wait 7 days) if you have: Fever higher than 102.29F (39.2C)  Sudden and severe pain in the face and head  Trouble seeing or seeing double  Trouble thinking clearly  Swelling or redness around 1 or both eyes  Trouble breathing or a stiff neck   Is there anything I can  do on my own to feel better? - Yes. To reduce your symptoms, you can: Take an over-the-counter pain reliever to reduce the pain  Rinse your nose and sinuses with salt water a few times a day - Ask your doctor or nurse about the best way to do this.  Use a decongestant nose spray - These sprays are sold in a pharmacy. But do not use decongestant nose sprays for more than 2 to 3 days in a row. Using them more than 3 days in a row can make symptoms worse.   You should NOT take an antihistamine for sinusitis. Common antihistamines include diphenhydramine (sample brand name: Benadryl), chlorpheniramine (sample brand name: Chlor-Trimeton), loratadine (sample brand name: Claritin), and cetirizine (sample brand name: Zyrtec). They can treat allergies, but not sinus infections, and could increase your discomfort by drying the lining of your nose and sinuses, or making you tired.   Your doctor might also prescribe a steroid nose spray to reduce the swelling in your nose. (Steroid nose sprays do not contain the same steroids that athletes take to build muscle.)  How is sinusitis treated? - Most of the time, sinusitis does not need to be treated with antibiotic medicines. This is because most sinusitis is caused by viruses - not bacteria - and antibiotics do not kill viruses. Many people get  over sinus infections without antibiotics.  Some people with sinusitis do need treatment with antibiotics. If your symptoms have not improved after 7 to 10 days, ask your doctor if you should take antibiotics. Your doctor might recommend that you wait 1 more week to see if your symptoms improve. But if you have symptoms such as a fever or a lot of pain, he or she might prescribe antibiotics. It is important to follow your doctor's instructions about taking your antibiotics.  What if my symptoms do not get better? - If your symptoms do not get better, talk with your doctor or nurse. He or she might order tests to figure out why  you still have symptoms. These can include:  CT scan or other imaging tests - Imaging tests create pictures of the inside of the body.  A test to look inside the sinuses - For this test, a doctor puts a thin tube with a camera on the end into the nose and up into the sinuses.   Some people get a lot of sinus infections or have symptoms that last at least 3 months. These people can have a different type of sinusitis called "chronic sinusitis." Chronic sinusitis can be caused by different things. For example, some people have growths in their nose or sinuses that are called "polyps." Other people have allergies that cause their symptoms.  Chronic sinusitis can be treated in different ways. If you have chronic sinusitis, talk with your doctor about which treatments are right for you.

## 2012-08-28 DIAGNOSIS — L570 Actinic keratosis: Secondary | ICD-10-CM | POA: Diagnosis not present

## 2012-08-28 DIAGNOSIS — L821 Other seborrheic keratosis: Secondary | ICD-10-CM | POA: Diagnosis not present

## 2012-10-27 ENCOUNTER — Encounter: Payer: Self-pay | Admitting: Internal Medicine

## 2012-10-27 ENCOUNTER — Ambulatory Visit (INDEPENDENT_AMBULATORY_CARE_PROVIDER_SITE_OTHER): Payer: Medicare Other | Admitting: Internal Medicine

## 2012-10-27 VITALS — BP 130/80 | HR 71 | Temp 98.7°F | Wt 125.0 lb

## 2012-10-27 DIAGNOSIS — I10 Essential (primary) hypertension: Secondary | ICD-10-CM

## 2012-10-27 DIAGNOSIS — K589 Irritable bowel syndrome without diarrhea: Secondary | ICD-10-CM

## 2012-10-27 DIAGNOSIS — N8111 Cystocele, midline: Secondary | ICD-10-CM

## 2012-10-27 DIAGNOSIS — E785 Hyperlipidemia, unspecified: Secondary | ICD-10-CM | POA: Diagnosis not present

## 2012-10-27 DIAGNOSIS — N8110 Cystocele, unspecified: Secondary | ICD-10-CM

## 2012-10-27 DIAGNOSIS — Z0000 Encounter for general adult medical examination without abnormal findings: Secondary | ICD-10-CM

## 2012-10-27 DIAGNOSIS — R413 Other amnesia: Secondary | ICD-10-CM | POA: Diagnosis not present

## 2012-10-27 DIAGNOSIS — F0280 Dementia in other diseases classified elsewhere without behavioral disturbance: Secondary | ICD-10-CM | POA: Insufficient documentation

## 2012-10-27 DIAGNOSIS — K580 Irritable bowel syndrome with diarrhea: Secondary | ICD-10-CM

## 2012-10-27 LAB — COMPREHENSIVE METABOLIC PANEL
Albumin: 4 g/dL (ref 3.5–5.2)
CO2: 30 mEq/L (ref 19–32)
GFR: 66.44 mL/min (ref >60.00)
Glucose, Bld: 91 mg/dL (ref 70–99)
Sodium: 140 mEq/L (ref 135–145)
Total Bilirubin: 0.9 mg/dL (ref 0.3–1.2)
Total Protein: 6.8 g/dL (ref 6.0–8.3)

## 2012-10-27 LAB — CBC WITH DIFFERENTIAL/PLATELET
Eosinophils Relative: 2.3 % (ref 0.0–5.0)
HCT: 40.4 % (ref 36.0–46.0)
Hemoglobin: 13.4 g/dL (ref 12.0–15.0)
Lymphocytes Relative: 26.2 % (ref 12.0–46.0)
Lymphs Abs: 1.7 10*3/uL (ref 0.7–4.0)
Monocytes Relative: 10.1 % (ref 3.0–12.0)
Platelets: 179 10*3/uL (ref 150.0–400.0)
WBC: 6.4 10*3/uL (ref 4.5–10.5)

## 2012-10-27 LAB — LIPID PANEL
Total CHOL/HDL Ratio: 4
Triglycerides: 178 mg/dL — ABNORMAL HIGH (ref 0.0–149.0)

## 2012-10-27 LAB — VITAMIN B12: Vitamin B-12: 524 pg/mL (ref 211–911)

## 2012-10-27 LAB — TSH: TSH: 1.05 u[IU]/mL (ref 0.35–5.50)

## 2012-10-27 LAB — MICROALBUMIN / CREATININE URINE RATIO
Microalb Creat Ratio: 0.4 mg/g (ref 0.0–30.0)
Microalb, Ur: 0.2 mg/dL (ref 0.0–1.9)

## 2012-10-27 MED ORDER — HYOSCYAMINE SULFATE 0.125 MG SL SUBL: 0.1250 mg | SUBLINGUAL_TABLET | SUBLINGUAL | Status: DC | PRN

## 2012-10-27 MED ORDER — MIRTAZAPINE 45 MG PO TABS: 45.0000 mg | ORAL_TABLET | Freq: Every day | ORAL | Status: DC

## 2012-10-27 MED ORDER — ESTROGENS, CONJUGATED 0.625 MG/GM VA CREA: TOPICAL_CREAM | VAGINAL | Status: DC

## 2012-10-27 MED ORDER — LOPERAMIDE HCL 2 MG PO TABS: 2.0000 mg | ORAL_TABLET | Freq: Four times a day (QID) | ORAL | Status: DC | PRN

## 2012-10-27 NOTE — Assessment & Plan Note (Signed)
Pt concerned about progressive memory loss. Exam is normal. Reviewed previous records including MRI brain from [facility] which showed microvascular changes, but no acute processes. Will check Vit B12 and TSH with labs. Will set up cognitive testing.

## 2012-10-27 NOTE — Assessment & Plan Note (Signed)
Symptomatically worsening. Encouraged her to followup with her urogynecologist.

## 2012-10-27 NOTE — Progress Notes (Signed)
Subjective:    Patient ID: Maria Bass, female    DOB: 28-Jan-1930, 79 y.o.   MRN: [id number]  HPI 79 year old female with history of bronchiectasis, atrophic vaginitis, bladder prolapse, hypertension, hyperlipidemia presents for followup. She reports she is generally feeling well. She notes that her bladder prolapse seems to have worsened over the last few months. She notes increased pelvic pressure. She does not have difficulty urinating in general. She does not have increased urinary frequency, urgency, or hematuria. She was seen by urogynecology in the past and opted not to proceed with surgery.  She is concerned today about memory loss. She reports that the last several years she has noted gradual worsening of her memory. She has difficulty remembering names. She recently forgot a password for her to get into her complex. She was evaluated for this in the past and had MRI brain which showed microvascular disease in 2000. She has never taken medication for memory loss.  Outpatient Encounter Prescriptions as of 10/27/2012  Medication Sig Dispense Refill  . Ascorbic Acid (VITAMIN C) 1000 MG tablet Take 1,000 mg by mouth daily.        [name] atorvastatin (LIPITOR) 20 MG tablet Take 1 tablet (20 mg total) by mouth daily.  90 tablet  1  . Biotin 1000 MCG tablet Take 5,000 mcg by mouth daily.       . cholecalciferol (VITAMIN D) 1000 UNITS tablet Take 1,000 Units by mouth daily.        [name] conjugated estrogens (PREMARIN) vaginal cream Place 0.5 g vaginally twice a week.  90 g  3  . hyoscyamine (LEVSIN SL) 0.125 MG SL tablet Place 1 tablet (0.125 mg total) under the tongue every 4 (four) hours as needed for cramping.  90 tablet  3  . loperamide (IMODIUM A-D) 2 MG tablet Take 1 tablet (2 mg total) by mouth 4 (four) times daily as needed for diarrhea or loose stools.  90 tablet  3  . losartan (COZAAR) 50 MG tablet TAKE 1 TABLET DAILY  90 tablet  3  . mirtazapine (REMERON) 45 MG tablet Take 1 tablet (45 mg  total) by mouth at bedtime.  90 tablet  3  . Probiotic Product (PROBIOTIC DAILY PO) Take 1 tablet by mouth daily.      . mometasone (NASONEX) 50 MCG/ACT nasal spray Place 2 sprays into the nose daily.  17 g  2   No facility-administered encounter medications on file as of 10/27/2012.   BP 130/80  Pulse 71  Temp(Src) 98.7 F (37.1 C) (Oral)  Wt 125 lb (56.7 kg)  BMI 22.86 kg/m2  SpO2 95%  Review of Systems  Constitutional: Negative for fever, chills, appetite change, fatigue and unexpected weight change.  HENT: Positive for rhinorrhea and postnasal drip. Negative for ear pain, congestion, sore throat, trouble swallowing, neck pain, voice change and sinus pressure.   Eyes: Negative for visual disturbance.  Respiratory: Positive for cough (occasional). Negative for shortness of breath, wheezing and stridor.   Cardiovascular: Negative for chest pain, palpitations and leg swelling.  Gastrointestinal: Negative for nausea, vomiting, abdominal pain, diarrhea, constipation, blood in stool, abdominal distention and anal bleeding.  Genitourinary: Positive for difficulty urinating and pelvic pain (pressure). Negative for dysuria, frequency, hematuria and flank pain.  Musculoskeletal: Negative for myalgias, arthralgias and gait problem.  Skin: Negative for color change and rash.  Neurological: Negative for dizziness and headaches.  Hematological: Negative for adenopathy. Does not bruise/bleed easily.  Psychiatric/Behavioral: Negative for suicidal ideas, sleep  disturbance and dysphoric mood. The patient is not nervous/anxious.        Objective:   Physical Exam  Constitutional: She is oriented to person, place, and time. She appears well-developed and well-nourished. No distress.  HENT:  Head: Normocephalic and atraumatic.  Right Ear: External ear normal.  Left Ear: External ear normal.  Nose: Nose normal.  Mouth/Throat: Oropharynx is clear and moist. No oropharyngeal exudate.  Eyes:  Conjunctivae are normal. Pupils are equal, round, and reactive to light. Right eye exhibits no discharge. Left eye exhibits no discharge. No scleral icterus.  Neck: Normal range of motion. Neck supple. No tracheal deviation present. No thyromegaly present.  Cardiovascular: Normal rate, regular rhythm, normal heart sounds and intact distal pulses.  Exam reveals no gallop and no friction rub.   No murmur heard. Pulmonary/Chest: Effort normal and breath sounds normal. No accessory muscle usage. Not tachypneic. No respiratory distress. She has no decreased breath sounds. She has no wheezes. She has no rhonchi. She has no rales. She exhibits no tenderness.  Abdominal: Soft. Bowel sounds are normal. She exhibits no distension and no mass. There is no tenderness. There is no rebound and no guarding.  Musculoskeletal: Normal range of motion. She exhibits no edema and no tenderness.  Lymphadenopathy:    She has no cervical adenopathy.  Neurological: She is alert and oriented to person, place, and time. No cranial nerve deficit. She exhibits normal muscle tone. Coordination normal.  Skin: Skin is warm and dry. No rash noted. She is not diaphoretic. No erythema. No pallor.  Psychiatric: She has a normal mood and affect. Her behavior is normal. Judgment and thought content normal.          Assessment & Plan:

## 2012-10-27 NOTE — Assessment & Plan Note (Signed)
BP Readings from Last 3 Encounters:  10/27/12 130/80  07/16/12 104/63  04/28/12 136/80   BP well controlled on current medications. Will continue

## 2012-10-27 NOTE — Assessment & Plan Note (Signed)
Will check lipids and lfts with labs. 

## 2012-10-27 NOTE — Assessment & Plan Note (Signed)
Symptoms improved and controlled with current medications. Will continue.

## 2012-12-24 DIAGNOSIS — F4322 Adjustment disorder with anxiety: Secondary | ICD-10-CM | POA: Diagnosis not present

## 2013-02-09 ENCOUNTER — Ambulatory Visit (INDEPENDENT_AMBULATORY_CARE_PROVIDER_SITE_OTHER): Payer: Medicare Other | Admitting: Internal Medicine

## 2013-02-09 ENCOUNTER — Encounter: Payer: Self-pay | Admitting: Internal Medicine

## 2013-02-09 VITALS — BP 118/64 | HR 66 | Temp 98.0°F | Ht 61.5 in | Wt 125.0 lb

## 2013-02-09 DIAGNOSIS — Z0000 Encounter for general adult medical examination without abnormal findings: Secondary | ICD-10-CM | POA: Diagnosis not present

## 2013-02-09 DIAGNOSIS — R413 Other amnesia: Secondary | ICD-10-CM | POA: Diagnosis not present

## 2013-02-09 LAB — COMPREHENSIVE METABOLIC PANEL
ALT: 22 U/L (ref 0–35)
Alkaline Phosphatase: 136 U/L — ABNORMAL HIGH (ref 39–117)
CO2: 28 mEq/L (ref 19–32)
GFR: 78.8 mL/min (ref >60.00)
Potassium: 4.5 mEq/L (ref 3.5–5.1)
Sodium: 140 mEq/L (ref 135–145)
Total Bilirubin: 1.3 mg/dL — ABNORMAL HIGH (ref 0.3–1.2)
Total Protein: 6.7 g/dL (ref 6.0–8.3)

## 2013-02-09 LAB — LIPID PANEL
Cholesterol: 190 mg/dL (ref 0–200)
LDL Cholesterol: 110 mg/dL — ABNORMAL HIGH (ref 0–99)
Triglycerides: 157 mg/dL — ABNORMAL HIGH (ref 0.0–149.0)
VLDL: 31.4 mg/dL (ref 0.0–40.0)

## 2013-02-09 LAB — MICROALBUMIN / CREATININE URINE RATIO
Creatinine,U: 31.9 mg/dL
Microalb Creat Ratio: 0.6 mg/g (ref 0.0–30.0)
Microalb, Ur: 0.2 mg/dL (ref 0.0–1.9)

## 2013-02-09 MED ORDER — LOSARTAN POTASSIUM 50 MG PO TABS: ORAL_TABLET | ORAL | Status: DC

## 2013-02-09 NOTE — Assessment & Plan Note (Signed)
General medical exam including breast exam normal today. Pap and pelvic deferred given patient's age. Appropriate screening performed. Discussed the pros and cons of additional testing for mild memory loss and potential treatment for mild memory loss. Patient would prefer to monitor for now. Mammogram ordered. Labs ordered including CMP, lipid profile, urine microalbumin. Immunizations are up-to-date.

## 2013-02-09 NOTE — Assessment & Plan Note (Signed)
Discussed the pros and cons of additional testing for memory loss. Patient would like to defer any additional testing for now.

## 2013-02-09 NOTE — Progress Notes (Signed)
Subjective:    Patient ID: Maria Bass, female    DOB: 15-Aug-1929, 79 y.o.   MRN: [id number]  HPI The patient is here for annual Medicare wellness examination and management of other chronic and acute problems.   The risk factors are reflected in the social history.  The roster of all physicians providing medical care to patient - is listed in the Snapshot section of the chart.  Activities of daily living:  The patient is 100% independent in all ADLs: dressing, toileting, feeding as well as independent mobility  Home safety : The patient has smoke detectors in the home. They wear seatbelts.  There are no firearms at home. There is no violence in the home.   There is no risks for hepatitis, STDs or HIV. There is no history of blood transfusion. They have no travel history to infectious disease endemic areas of the world.  The patient has seen their dentist in the last six month (Dr. Lissa Hoard). They have seen their eye doctor in the last year (Dr. Haskel Khan).  They admit to slight hearing difficulty with regard to whispered voices and some television programs.  They have deferred audiologic testing in the last year.  Would like to defer for now.  They do not have excessive sun exposure. Discussed the need for sun protection: hats, long sleeves and use of sunscreen if there is significant sun exposure.   Diet: the importance of a healthy diet is discussed. They do have a healthy diet.   The benefits of regular aerobic exercise were discussed. She works out at [employer] typically 3-4 times per week.  Living Will - in place. HCPOA - Johny Shears.  Depression screen: there are no signs or vegative symptoms of depression- irritability, change in appetite, anhedonia, sadness/tearfullness.  Cognitive assessment: the patient manages all their financial and personal affairs and is actively engaged. They could relate day,date,year and events. Pt reports having some trouble remembering names,  and some problems with short term memory. She has been able to manage this with lists and reminder notes. She was seen in consultation by psychologist to evaluate memory loss, but opted not to proceed with additional testing.  The following portions of the patient's history were reviewed and updated as appropriate: allergies, current medications, past family history, past medical history,  past surgical history, past social history  and problem list.  Visual acuity was not assessed per patient preference since she has regular follow up with her ophthalmologist. Hearing and body mass index were assessed and reviewed.   During the course of the visit the patient was educated and counseled about appropriate screening and preventive services including : fall prevention , diabetes screening, nutrition counseling, colorectal cancer screening, and recommended immunizations.    Outpatient Prescriptions Prior to Visit  Medication Sig Dispense Refill  . Ascorbic Acid (VITAMIN C) 1000 MG tablet Take 1,000 mg by mouth daily.        [name] atorvastatin (LIPITOR) 20 MG tablet Take 1 tablet (20 mg total) by mouth daily.  90 tablet  1  . Biotin 1000 MCG tablet Take 5,000 mcg by mouth daily.       . cholecalciferol (VITAMIN D) 1000 UNITS tablet Take 1,000 Units by mouth daily.        [name] conjugated estrogens (PREMARIN) vaginal cream Place 0.5 g vaginally twice a week.  90 g  3  . hyoscyamine (LEVSIN SL) 0.125 MG SL tablet Place 1 tablet (0.125 mg total) under the tongue every  4 (four) hours as needed for cramping.  90 tablet  3  . loperamide (IMODIUM A-D) 2 MG tablet Take 1 tablet (2 mg total) by mouth 4 (four) times daily as needed for diarrhea or loose stools.  90 tablet  3  . mirtazapine (REMERON) 45 MG tablet Take 1 tablet (45 mg total) by mouth at bedtime.  90 tablet  3  . Probiotic Product (PROBIOTIC DAILY PO) Take 1 tablet by mouth daily.      [name] losartan (COZAAR) 50 MG tablet TAKE 1 TABLET DAILY  90 tablet  3  .  mometasone (NASONEX) 50 MCG/ACT nasal spray Place 2 sprays into the nose daily.  17 g  2   No facility-administered medications prior to visit.   BP 118/64  Pulse 66  Temp(Src) 98 F (36.7 C) (Oral)  Ht 5' 1.5" (1.562 m)  Wt 125 lb (56.7 kg)  BMI 23.24 kg/m2  SpO2 95%   Review of Systems  Constitutional: Negative for fever, chills, appetite change, fatigue and unexpected weight change.  HENT: Negative for congestion, ear pain, sinus pressure, sore throat, trouble swallowing and voice change.   Eyes: Negative for visual disturbance.  Respiratory: Negative for cough, shortness of breath, wheezing and stridor.   Cardiovascular: Negative for chest pain, palpitations and leg swelling.  Gastrointestinal: Negative for nausea, vomiting, abdominal pain, diarrhea, constipation, blood in stool, abdominal distention and anal bleeding.  Genitourinary: Negative for dysuria and flank pain.  Musculoskeletal: Negative for arthralgias, gait problem, myalgias and neck pain.  Skin: Negative for color change and rash.  Neurological: Negative for dizziness and headaches.  Hematological: Negative for adenopathy. Does not bruise/bleed easily.  Psychiatric/Behavioral: Positive for decreased concentration. Negative for suicidal ideas, sleep disturbance and dysphoric mood. The patient is not nervous/anxious.        Objective:   Physical Exam  Constitutional: She is oriented to person, place, and time. She appears well-developed and well-nourished. No distress.  HENT:  Head: Normocephalic and atraumatic.  Right Ear: External ear normal.  Left Ear: External ear normal.  Nose: Nose normal.  Mouth/Throat: Oropharynx is clear and moist. No oropharyngeal exudate.  Eyes: Conjunctivae are normal. Pupils are equal, round, and reactive to light. Right eye exhibits no discharge. Left eye exhibits no discharge. No scleral icterus.  Neck: Normal range of motion. Neck supple. No tracheal deviation present. No  thyromegaly present.  Cardiovascular: Normal rate, regular rhythm, normal heart sounds and intact distal pulses.  Exam reveals no gallop and no friction rub.   No murmur heard. Pulmonary/Chest: Effort normal and breath sounds normal. No accessory muscle usage. Not tachypneic. No respiratory distress. She has no decreased breath sounds. She has no wheezes. She has no rhonchi. She has no rales. She exhibits no tenderness. Right breast exhibits no inverted nipple, no mass, no nipple discharge, no skin change and no tenderness. Left breast exhibits no inverted nipple, no mass, no nipple discharge, no skin change and no tenderness. Breasts are symmetrical.  Abdominal: Soft. Bowel sounds are normal. She exhibits no distension and no mass. There is no tenderness. There is no rebound and no guarding.  Musculoskeletal: Normal range of motion. She exhibits no edema and no tenderness.  Lymphadenopathy:    She has no cervical adenopathy.  Neurological: She is alert and oriented to person, place, and time. No cranial nerve deficit. She exhibits normal muscle tone. Coordination normal.  Skin: Skin is warm and dry. No rash noted. She is not diaphoretic. No erythema. No pallor.  Psychiatric: She has a normal mood and affect. Her behavior is normal. Judgment and thought content normal.          Assessment & Plan:

## 2013-02-26 ENCOUNTER — Encounter: Payer: Self-pay | Admitting: Internal Medicine

## 2013-02-26 DIAGNOSIS — K580 Irritable bowel syndrome with diarrhea: Secondary | ICD-10-CM

## 2013-02-26 MED ORDER — LOPERAMIDE HCL 2 MG PO TABS: 2.0000 mg | ORAL_TABLET | Freq: Four times a day (QID) | ORAL | Status: DC | PRN

## 2013-03-16 ENCOUNTER — Ambulatory Visit: Payer: Self-pay | Admitting: Internal Medicine

## 2013-03-16 DIAGNOSIS — Z1231 Encounter for screening mammogram for malignant neoplasm of breast: Secondary | ICD-10-CM | POA: Diagnosis not present

## 2013-03-16 LAB — HM MAMMOGRAPHY: HM MAMMO: NORMAL

## 2013-04-24 ENCOUNTER — Telehealth: Payer: Self-pay | Admitting: *Deleted

## 2013-04-24 ENCOUNTER — Other Ambulatory Visit: Payer: Self-pay | Admitting: *Deleted

## 2013-04-24 DIAGNOSIS — R17 Unspecified jaundice: Secondary | ICD-10-CM

## 2013-04-24 NOTE — Telephone Encounter (Signed)
Pt is coming in for labs 01.05.2015 what labs and dx? 

## 2013-04-24 NOTE — Telephone Encounter (Signed)
Repeat CMP for elevated bilirubin

## 2013-04-27 ENCOUNTER — Other Ambulatory Visit (INDEPENDENT_AMBULATORY_CARE_PROVIDER_SITE_OTHER): Payer: Medicare Other

## 2013-04-27 DIAGNOSIS — R17 Unspecified jaundice: Secondary | ICD-10-CM

## 2013-04-27 LAB — COMPREHENSIVE METABOLIC PANEL
ALT: 23 U/L (ref 0–35)
AST: 21 U/L (ref 0–37)
Albumin: 4.1 g/dL (ref 3.5–5.2)
Alkaline Phosphatase: 114 U/L (ref 39–117)
BILIRUBIN TOTAL: 0.8 mg/dL (ref 0.3–1.2)
BUN: 18 mg/dL (ref 6–23)
CHLORIDE: 107 meq/L (ref 96–112)
CO2: 28 meq/L (ref 19–32)
Calcium: 9.4 mg/dL (ref 8.4–10.5)
Creatinine, Ser: 1 mg/dL (ref 0.4–1.2)
GFR: 58.53 mL/min — AB (ref >60.00)
Glucose, Bld: 88 mg/dL (ref 70–99)
Potassium: 4.4 mEq/L (ref 3.5–5.1)
SODIUM: 141 meq/L (ref 135–145)
TOTAL PROTEIN: 7.1 g/dL (ref 6.0–8.3)

## 2013-04-29 DIAGNOSIS — Z85828 Personal history of other malignant neoplasm of skin: Secondary | ICD-10-CM | POA: Diagnosis not present

## 2013-04-29 DIAGNOSIS — D235 Other benign neoplasm of skin of trunk: Secondary | ICD-10-CM | POA: Diagnosis not present

## 2013-04-29 DIAGNOSIS — L821 Other seborrheic keratosis: Secondary | ICD-10-CM | POA: Diagnosis not present

## 2013-04-29 DIAGNOSIS — L570 Actinic keratosis: Secondary | ICD-10-CM | POA: Diagnosis not present

## 2013-05-04 DIAGNOSIS — H2510 Age-related nuclear cataract, unspecified eye: Secondary | ICD-10-CM | POA: Diagnosis not present

## 2013-05-04 DIAGNOSIS — Z961 Presence of intraocular lens: Secondary | ICD-10-CM | POA: Diagnosis not present

## 2013-06-12 ENCOUNTER — Encounter: Payer: Self-pay | Admitting: Internal Medicine

## 2013-06-12 ENCOUNTER — Ambulatory Visit (INDEPENDENT_AMBULATORY_CARE_PROVIDER_SITE_OTHER): Payer: Medicare Other | Admitting: Internal Medicine

## 2013-06-12 VITALS — BP 132/70 | HR 79 | Temp 97.8°F | Wt 126.0 lb

## 2013-06-12 DIAGNOSIS — R1032 Left lower quadrant pain: Secondary | ICD-10-CM | POA: Diagnosis not present

## 2013-06-12 NOTE — Assessment & Plan Note (Signed)
Symptoms and exam are most consistent with diverticulitis. Recommended getting CT of the abdomen and pelvis for further evaluation. Patient prefers to have this scheduled for next week as she is currently the primary caregiver for her husband who has metastatic prostate cancer, and cannot leave her home for prolonged periods. She will need to schedule time for this scan. Recommended following a clear liquid diet with avoidance specifically of foods with small particles such as nuts. We also discussed potentially starting antibiotics with Cipro and Flagyl. However, will hold off for now given that her symptoms seem to be improving. Follow up after CT scan complete or sooner as needed.

## 2013-06-12 NOTE — Progress Notes (Signed)
Pre visit review using our clinic review tool, if applicable. No additional management support is needed unless otherwise documented below in the visit note. 

## 2013-06-12 NOTE — Patient Instructions (Signed)
Diverticulitis °A diverticulum is a small pouch or sac on the colon. Diverticulosis is the presence of these diverticula on the colon. Diverticulitis is the irritation (inflammation) or infection of diverticula. °CAUSES  °The colon and its diverticula contain bacteria. If food particles block the tiny opening to a diverticulum, the bacteria inside can grow and cause an increase in pressure. This leads to infection and inflammation and is called diverticulitis. °SYMPTOMS  °· Abdominal pain and tenderness. Usually, the pain is located on the left side of your abdomen. However, it could be located elsewhere. °· Fever. °· Bloating. °· Feeling sick to your stomach (nausea). °· Throwing up (vomiting). °· Abnormal stools. °DIAGNOSIS  °Your caregiver will take a history and perform a physical exam. Since many things can cause abdominal pain, other tests may be necessary. Tests may include: °· Blood tests. °· Urine tests. °· X-ray of the abdomen. °· CT scan of the abdomen. °Sometimes, surgery is needed to determine if diverticulitis or other conditions are causing your symptoms. °TREATMENT  °Most of the time, you can be treated without surgery. Treatment includes: °· Resting the bowels by only having liquids for a few days. As you improve, you will need to eat a low-fiber diet. °· Intravenous (IV) fluids if you are losing body fluids (dehydrated). °· Antibiotic medicines that treat infections may be given. °· Pain and nausea medicine, if needed. °· Surgery if the inflamed diverticulum has burst. °HOME CARE INSTRUCTIONS  °· Try a clear liquid diet (broth, tea, or water for as long as directed by your caregiver). You may then gradually begin a low-fiber diet as tolerated.  °A low-fiber diet is a diet with less than 10 grams of fiber. Choose the foods below to reduce fiber in the diet: °· White breads, cereals, rice, and pasta. °· Cooked fruits and vegetables or soft fresh fruits and vegetables without the skin. °· Ground or  well-cooked tender beef, ham, veal, lamb, pork, or poultry. °· Eggs and seafood. °· After your diverticulitis symptoms have improved, your caregiver may put you on a high-fiber diet. A high-fiber diet includes 14 grams of fiber for every 1000 calories consumed. For a standard 2000 calorie diet, you would need 28 grams of fiber. Follow these diet guidelines to help you increase the fiber in your diet. It is important to slowly increase the amount fiber in your diet to avoid gas, constipation, and bloating. °· Choose whole-grain breads, cereals, pasta, and brown rice. °· Choose fresh fruits and vegetables with the skin on. Do not overcook vegetables because the more vegetables are cooked, the more fiber is lost. °· Choose more nuts, seeds, legumes, dried peas, beans, and lentils. °· Look for food products that have greater than 3 grams of fiber per serving on the Nutrition Facts label. °· Take all medicine as directed by your caregiver. °· If your caregiver has given you a follow-up appointment, it is very important that you go. Not going could result in lasting (chronic) or permanent injury, pain, and disability. If there is any problem keeping the appointment, call to reschedule. °SEEK MEDICAL CARE IF:  °· Your pain does not improve. °· You have a hard time advancing your diet beyond clear liquids. °· Your bowel movements do not return to normal. °SEEK IMMEDIATE MEDICAL CARE IF:  °· Your pain becomes worse. °· You have an oral temperature above 102° F (38.9° C), not controlled by medicine. °· You have repeated vomiting. °· You have bloody or black, tarry stools. °·   Symptoms that brought you to your caregiver become worse or are not getting better. °MAKE SURE YOU:  °· Understand these instructions. °· Will watch your condition. °· Will get help right away if you are not doing well or get worse. °Document Released: 01/17/2005 Document Revised: 07/02/2011 Document Reviewed: 05/15/2010 °[employer]® Patient Information  ©2014 [employer]. ° °

## 2013-06-12 NOTE — Progress Notes (Signed)
Subjective:    Patient ID: Maria Bass, female    DOB: 1929-05-22, 80 y.o.   MRN: [id number]  HPI 80YO female presents for acute visit. Complains of left lower abdominal pain, intermittent, at times severe for several weeks. Abdomen has been tender to touch. Gas or BM makes symptoms worse. Occasionally radiates to rectum. No change in bowel habits, no diarrhea. No nausea or vomiting. No blood in stool or black stool. Occasional low back pain. Symptoms have been improving somewhat over last few days. No hematuria, dysuria, change in frequency. Last colonoscopy 2013 showed diverticulosis. No previous h/o diverticulitis.  Very difficult time for her, as her husband recently diagnosed with aggressive prostate cancer with metastatic diease. She is his primary caregiver.  Review of Systems  Constitutional: Negative for fever, chills, appetite change, fatigue and unexpected weight change.  HENT: Negative for congestion, ear pain, sinus pressure, sore throat, trouble swallowing and voice change.   Eyes: Negative for visual disturbance.  Respiratory: Negative for cough, shortness of breath, wheezing and stridor.   Cardiovascular: Negative for chest pain, palpitations and leg swelling.  Gastrointestinal: Positive for abdominal pain. Negative for nausea, vomiting, diarrhea, constipation, blood in stool, abdominal distention and anal bleeding.  Genitourinary: Negative for dysuria, urgency, frequency, hematuria, flank pain and decreased urine volume.  Musculoskeletal: Negative for arthralgias, gait problem, myalgias and neck pain.  Skin: Negative for color change and rash.  Neurological: Negative for dizziness and headaches.  Hematological: Negative for adenopathy. Does not bruise/bleed easily.  Psychiatric/Behavioral: Negative for suicidal ideas, sleep disturbance and dysphoric mood. The patient is not nervous/anxious.        Objective:    BP 132/70  Pulse 79  Temp(Src) 97.8 F (36.6 C)  (Oral)  Wt 126 lb (57.153 kg)  SpO2 97% Physical Exam  Constitutional: She is oriented to person, place, and time. She appears well-developed and well-nourished. No distress.  HENT:  Head: Normocephalic and atraumatic.  Right Ear: External ear normal.  Left Ear: External ear normal.  Nose: Nose normal.  Mouth/Throat: Oropharynx is clear and moist. No oropharyngeal exudate.  Eyes: Conjunctivae are normal. Pupils are equal, round, and reactive to light. Right eye exhibits no discharge. Left eye exhibits no discharge. No scleral icterus.  Neck: Normal range of motion. Neck supple. No tracheal deviation present. No thyromegaly present.  Cardiovascular: Normal rate, regular rhythm, normal heart sounds and intact distal pulses.  Exam reveals no gallop and no friction rub.   No murmur heard. Pulmonary/Chest: Effort normal and breath sounds normal. No accessory muscle usage. Not tachypneic. No respiratory distress. She has no decreased breath sounds. She has no wheezes. She has no rhonchi. She has no rales. She exhibits no tenderness.  Abdominal: Soft. Bowel sounds are normal. She exhibits no distension and no mass. There is tenderness in the left lower quadrant. There is no rebound and no guarding.    Musculoskeletal: Normal range of motion. She exhibits no edema and no tenderness.  Lymphadenopathy:    She has no cervical adenopathy.  Neurological: She is alert and oriented to person, place, and time. No cranial nerve deficit. She exhibits normal muscle tone. Coordination normal.  Skin: Skin is warm and dry. No rash noted. She is not diaphoretic. No erythema. No pallor.  Psychiatric: She has a normal mood and affect. Her behavior is normal. Judgment and thought content normal.          Assessment & Plan:   Problem List Items Addressed This Visit  None       No Follow-up on file.

## 2013-08-23 ENCOUNTER — Other Ambulatory Visit: Payer: Self-pay | Admitting: Internal Medicine

## 2013-11-24 ENCOUNTER — Encounter: Payer: Self-pay | Admitting: Internal Medicine

## 2013-11-26 MED ORDER — ESTROGENS, CONJUGATED 0.625 MG/GM VA CREA: TOPICAL_CREAM | VAGINAL | Status: DC

## 2013-12-08 ENCOUNTER — Ambulatory Visit (INDEPENDENT_AMBULATORY_CARE_PROVIDER_SITE_OTHER): Payer: Medicare Other | Admitting: Internal Medicine

## 2013-12-08 ENCOUNTER — Encounter: Payer: Self-pay | Admitting: Internal Medicine

## 2013-12-08 VITALS — BP 122/70 | HR 66 | Temp 98.1°F | Ht 61.5 in | Wt 122.8 lb

## 2013-12-08 DIAGNOSIS — R413 Other amnesia: Secondary | ICD-10-CM

## 2013-12-08 MED ORDER — ESTROGENS, CONJUGATED 0.625 MG/GM VA CREA: TOPICAL_CREAM | VAGINAL | Status: DC

## 2013-12-08 NOTE — Progress Notes (Signed)
Pre visit review using our clinic review tool, if applicable. No additional management support is needed unless otherwise documented below in the visit note. 

## 2013-12-08 NOTE — Assessment & Plan Note (Addendum)
Exam today normal. Recent TSH and B12 normal. Suspect anxiety is playing a role. Will set up cognitive testing for further evaluation.  Over 29min of which >50% spent in face-to-face contact with patient discussing plan of care

## 2013-12-08 NOTE — Progress Notes (Signed)
   Subjective:    Patient ID: Maria Bass, female    DOB: February 24, 1930, 80 y.o.   MRN: [id number]  HPI 80YO female presents for follow up.  Husband passed away in 2013/08/11. She notes significant anxiety in dealing with managing her estate planning after his death. Wanting to make plans for future living situation.  Memory loss - Family has noticed decreased short term memory. Problems at times with name finding, particularly over last few months. Continues to manage her own finances. No confusion. Continues to drive. Manages all ADLs.  Review of Systems  Constitutional: Negative for fever, chills, appetite change, fatigue and unexpected weight change.  Eyes: Negative for visual disturbance.  Respiratory: Negative for shortness of breath.   Cardiovascular: Negative for chest pain and leg swelling.  Gastrointestinal: Negative for abdominal pain.  Skin: Negative for color change and rash.  Hematological: Negative for adenopathy. Does not bruise/bleed easily.  Psychiatric/Behavioral: Positive for sleep disturbance, dysphoric mood and decreased concentration. The patient is nervous/anxious.        Objective:    BP 122/70  Pulse 66  Temp(Src) 98.1 F (36.7 C) (Oral)  Ht 5' 1.5" (1.562 m)  Wt 122 lb 12 oz (55.679 kg)  BMI 22.82 kg/m2  SpO2 96% Physical Exam  Constitutional: She is oriented to person, place, and time. She appears well-developed and well-nourished. No distress.  HENT:  Head: Normocephalic and atraumatic.  Right Ear: External ear normal.  Left Ear: External ear normal.  Nose: Nose normal.  Mouth/Throat: Oropharynx is clear and moist.  Eyes: Conjunctivae are normal. Pupils are equal, round, and reactive to light. Right eye exhibits no discharge. Left eye exhibits no discharge. No scleral icterus.  Neck: Normal range of motion. Neck supple. No tracheal deviation present. No thyromegaly present.  Cardiovascular: Normal rate, regular rhythm, normal heart sounds and  intact distal pulses.  Exam reveals no gallop and no friction rub.   No murmur heard. Pulmonary/Chest: Effort normal and breath sounds normal. No accessory muscle usage. Not tachypneic. No respiratory distress. She has no decreased breath sounds. She has no wheezes. She has no rhonchi. She has no rales. She exhibits no tenderness.  Musculoskeletal: Normal range of motion. She exhibits no edema and no tenderness.  Lymphadenopathy:    She has no cervical adenopathy.  Neurological: She is alert and oriented to person, place, and time. No cranial nerve deficit. She exhibits normal muscle tone. Coordination normal.  Skin: Skin is warm and dry. No rash noted. She is not diaphoretic. No erythema. No pallor.  Psychiatric: Her behavior is normal. Judgment and thought content normal. Her mood appears anxious.          Assessment & Plan:   Problem List Items Addressed This Visit   None       No Follow-up on file.

## 2013-12-08 NOTE — Patient Instructions (Signed)
We will set up cognitive testing.  Follow up as scheduled in 01/2014.

## 2013-12-12 ENCOUNTER — Encounter: Payer: Self-pay | Admitting: Internal Medicine

## 2013-12-18 ENCOUNTER — Other Ambulatory Visit: Payer: Self-pay | Admitting: Internal Medicine

## 2014-01-20 DIAGNOSIS — Z23 Encounter for immunization: Secondary | ICD-10-CM | POA: Diagnosis not present

## 2014-01-26 DIAGNOSIS — L84 Corns and callosities: Secondary | ICD-10-CM | POA: Diagnosis not present

## 2014-01-26 DIAGNOSIS — M2012 Hallux valgus (acquired), left foot: Secondary | ICD-10-CM | POA: Diagnosis not present

## 2014-01-26 DIAGNOSIS — M2011 Hallux valgus (acquired), right foot: Secondary | ICD-10-CM | POA: Diagnosis not present

## 2014-01-26 DIAGNOSIS — L03031 Cellulitis of right toe: Secondary | ICD-10-CM | POA: Diagnosis not present

## 2014-02-01 DIAGNOSIS — L97511 Non-pressure chronic ulcer of other part of right foot limited to breakdown of skin: Secondary | ICD-10-CM | POA: Diagnosis not present

## 2014-02-01 DIAGNOSIS — M2011 Hallux valgus (acquired), right foot: Secondary | ICD-10-CM | POA: Diagnosis not present

## 2014-02-01 DIAGNOSIS — M898X7 Other specified disorders of bone, ankle and foot: Secondary | ICD-10-CM | POA: Diagnosis not present

## 2014-02-10 ENCOUNTER — Encounter: Payer: Medicare Other | Admitting: Internal Medicine

## 2014-02-18 ENCOUNTER — Ambulatory Visit (INDEPENDENT_AMBULATORY_CARE_PROVIDER_SITE_OTHER): Payer: Medicare Other | Admitting: Internal Medicine

## 2014-02-18 ENCOUNTER — Encounter: Payer: Self-pay | Admitting: Internal Medicine

## 2014-02-18 VITALS — BP 124/76 | HR 71 | Temp 97.8°F | Ht 61.25 in | Wt 125.8 lb

## 2014-02-18 DIAGNOSIS — Z0000 Encounter for general adult medical examination without abnormal findings: Secondary | ICD-10-CM | POA: Diagnosis not present

## 2014-02-18 DIAGNOSIS — K219 Gastro-esophageal reflux disease without esophagitis: Secondary | ICD-10-CM

## 2014-02-18 DIAGNOSIS — L97511 Non-pressure chronic ulcer of other part of right foot limited to breakdown of skin: Secondary | ICD-10-CM | POA: Diagnosis not present

## 2014-02-18 DIAGNOSIS — R159 Full incontinence of feces: Secondary | ICD-10-CM | POA: Diagnosis not present

## 2014-02-18 DIAGNOSIS — L97519 Non-pressure chronic ulcer of other part of right foot with unspecified severity: Secondary | ICD-10-CM | POA: Insufficient documentation

## 2014-02-18 DIAGNOSIS — R413 Other amnesia: Secondary | ICD-10-CM

## 2014-02-18 DIAGNOSIS — Z23 Encounter for immunization: Secondary | ICD-10-CM | POA: Diagnosis not present

## 2014-02-18 DIAGNOSIS — E785 Hyperlipidemia, unspecified: Secondary | ICD-10-CM | POA: Diagnosis not present

## 2014-02-18 LAB — CBC WITH DIFFERENTIAL/PLATELET
Basophils Absolute: 0.1 10*3/uL (ref 0.0–0.1)
Basophils Relative: 0.7 % (ref 0.0–3.0)
Eosinophils Absolute: 0.2 10*3/uL (ref 0.0–0.7)
Eosinophils Relative: 2.5 % (ref 0.0–5.0)
HCT: 39.4 % (ref 36.0–46.0)
HEMOGLOBIN: 13 g/dL (ref 12.0–15.0)
Lymphocytes Relative: 25.7 % (ref 12.0–46.0)
Lymphs Abs: 2.1 10*3/uL (ref 0.7–4.0)
MCHC: 33 g/dL (ref 30.0–36.0)
MCV: 88.7 fl (ref 78.0–100.0)
MONOS PCT: 10.2 % (ref 3.0–12.0)
Monocytes Absolute: 0.8 10*3/uL (ref 0.1–1.0)
NEUTROS ABS: 4.9 10*3/uL (ref 1.4–7.7)
NEUTROS PCT: 60.9 % (ref 43.0–77.0)
Platelets: 187 10*3/uL (ref 150.0–400.0)
RBC: 4.45 Mil/uL (ref 3.87–5.11)
RDW: 12.5 % (ref 11.5–15.5)
WBC: 8 10*3/uL (ref 4.0–10.5)

## 2014-02-18 LAB — MICROALBUMIN / CREATININE URINE RATIO
Creatinine,U: 24.2 mg/dL
MICROALB/CREAT RATIO: 3.3 mg/g (ref 0.0–30.0)
Microalb, Ur: 0.8 mg/dL (ref 0.0–1.9)

## 2014-02-18 LAB — COMPREHENSIVE METABOLIC PANEL
ALT: 27 U/L (ref 0–35)
AST: 27 U/L (ref 0–37)
Albumin: 3.6 g/dL (ref 3.5–5.2)
Alkaline Phosphatase: 144 U/L — ABNORMAL HIGH (ref 39–117)
BUN: 18 mg/dL (ref 6–23)
CALCIUM: 9.2 mg/dL (ref 8.4–10.5)
CHLORIDE: 105 meq/L (ref 96–112)
CO2: 30 meq/L (ref 19–32)
CREATININE: 0.9 mg/dL (ref 0.4–1.2)
GFR: 65.36 mL/min (ref >60.00)
Glucose, Bld: 75 mg/dL (ref 70–99)
Potassium: 4.2 mEq/L (ref 3.5–5.1)
Sodium: 139 mEq/L (ref 135–145)
TOTAL PROTEIN: 6.8 g/dL (ref 6.0–8.3)
Total Bilirubin: 0.8 mg/dL (ref 0.2–1.2)

## 2014-02-18 LAB — LIPID PANEL
CHOL/HDL RATIO: 4
Cholesterol: 201 mg/dL — ABNORMAL HIGH (ref 0–200)
HDL: 56.4 mg/dL (ref >39.00)
LDL Cholesterol: 114 mg/dL — ABNORMAL HIGH (ref 0–99)
NONHDL: 144.6
Triglycerides: 152 mg/dL — ABNORMAL HIGH (ref 0.0–149.0)
VLDL: 30.4 mg/dL (ref 0.0–40.0)

## 2014-02-18 MED ORDER — PANTOPRAZOLE SODIUM 40 MG PO TBEC: 40.0000 mg | DELAYED_RELEASE_TABLET | Freq: Every day | ORAL | Status: DC

## 2014-02-18 MED ORDER — LOSARTAN POTASSIUM 50 MG PO TABS: ORAL_TABLET | ORAL | Status: DC

## 2014-02-18 MED ORDER — ATORVASTATIN CALCIUM 20 MG PO TABS: 20.0000 mg | ORAL_TABLET | Freq: Every day | ORAL | Status: DC

## 2014-02-18 NOTE — Assessment & Plan Note (Signed)
Occasional stool incontinence with diarrhea. Reviewed notes from Dr. Hilarie Fredrickson. Will continue prn Levsin and Imodium. Follow up prn.

## 2014-02-18 NOTE — Progress Notes (Signed)
Pre visit review using our clinic review tool, if applicable. No additional management support is needed unless otherwise documented below in the visit note. 

## 2014-02-18 NOTE — Assessment & Plan Note (Signed)
General medical exam including breast exam normal today. PAP and pelvic deferred given pt age and preference. Mammogram deferred per pt preference, plan repeat 2016. Labs today including CBC, CMP, lipids. Encouraged healthy diet and exercise.  Referral for cognitive testing placed. Prevnar today.

## 2014-02-18 NOTE — Assessment & Plan Note (Signed)
Persistent issues with short term memory. Will set up cognitive evaluation with [facility].

## 2014-02-18 NOTE — Assessment & Plan Note (Signed)
Erythematous area medial right great toe at site where toe abuts her 2nd toe. Will set up podiatry evaluation. Question if orthotic would be helpful.

## 2014-02-18 NOTE — Patient Instructions (Signed)

## 2014-02-18 NOTE — Assessment & Plan Note (Signed)
Recent worsening symptoms of GERD. Will add pantoprazole 40mg  daily. If no improvement, she will call or return to clinic.

## 2014-02-18 NOTE — Progress Notes (Signed)
Subjective:    Patient ID: Maria Bass, female    DOB: July 13, 1929, 80 y.o.   MRN: [id number]  HPI The patient is here for annual Medicare wellness examination and management of other chronic and acute problems.   The risk factors are reflected in the social history.  The roster of all physicians providing medical care to patient - is listed in the Snapshot section of the chart.  Activities of daily living:  The patient is 100% independent in all ADLs: dressing, toileting, feeding as well as independent mobility.  Lives alone. Husband recently passed away. She has stopped driving at night.  Home safety : The patient has smoke detectors in the home. They wear seatbelts.  There are no firearms at home. There is no violence in the home.   There is no risks for hepatitis, STDs or HIV. There is no history of blood transfusion. They have no travel history to infectious disease endemic areas of the world.  The patient has seen their dentist in the last six month (Dr. Luiz Ochoa). They have seen their eye doctor in the last year. Notes that vision has gotten worse. (Dr. Mordecai Rasmussen).  They admit to slight hearing difficulty with regard to whispered voices and some television programs.  They have deferred audiologic testing in the last year.  Would like to defer for now.  They do not have excessive sun exposure. Discussed the need for sun protection: hats, long sleeves and use of sunscreen if there is significant sun exposure.   Diet: the importance of a healthy diet is discussed. They do have a healthy diet.   The benefits of regular aerobic exercise were discussed. She works out at [employer] typically 3-4 times per week. Limited some recently with ongoing estate issues.  Living Will - in place. HCPOA - Veleta Miners.  Depression screen: there are no signs or vegative symptoms of depression- irritability, change in appetite, anhedonia, sadness/tearfullness.  Cognitive assessment: the patient  manages all their financial and personal affairs and is actively engaged. They could relate day,date,year and events. Continues to have some issues with short term memory.  The following portions of the patient's history were reviewed and updated as appropriate: allergies, current medications, past family history, past medical history,  past surgical history, past social history  and problem list.  Visual acuity was not assessed per patient preference since she has regular follow up with her ophthalmologist. Hearing and body mass index were assessed and reviewed.   During the course of the visit the patient was educated and counseled about appropriate screening and preventive services including : fall prevention , diabetes screening, nutrition counseling, colorectal cancer screening, and recommended immunizations.     ACUTE ISSUES - Ulcerated area right medial great toe. Would like a referral to podiatry. Area is painful and continues to rub on 2nd toe.  Also concerned about increased burning pain in epigastric area esp at night over last several months. Not taking anything for this.  Notes some stool incontinence and urgency of BMs in the morning over the last year. Started taking Levsin and Imodium as prescribed by Dr. Hilarie Fredrickson, with some improvement in symptoms. No blood in stool. No abdominal pain. Stools are described as loose.  Review of Systems  Constitutional: Negative for fever, chills, appetite change, fatigue and unexpected weight change.  Eyes: Negative for visual disturbance.  Respiratory: Negative for shortness of breath.   Cardiovascular: Negative for chest pain and leg swelling.  Gastrointestinal: Negative for nausea,  vomiting, abdominal pain, diarrhea and constipation.  Musculoskeletal: Negative for arthralgias and myalgias.  Skin: Positive for color change and wound. Negative for rash.  Hematological: Negative for adenopathy. Does not bruise/bleed easily.    Psychiatric/Behavioral: Positive for decreased concentration. Negative for sleep disturbance and dysphoric mood. The patient is nervous/anxious.        Objective:    BP 124/76  Pulse 71  Temp(Src) 97.8 F (36.6 C) (Oral)  Ht 5' 1.25" (1.556 m)  Wt 125 lb 12 oz (57.04 kg)  BMI 23.56 kg/m2  SpO2 94% Physical Exam  Constitutional: She is oriented to person, place, and time. She appears well-developed and well-nourished. No distress.  HENT:  Head: Normocephalic and atraumatic.  Right Ear: External ear normal.  Left Ear: External ear normal.  Nose: Nose normal.  Mouth/Throat: Oropharynx is clear and moist. No oropharyngeal exudate.  Eyes: Conjunctivae are normal. Pupils are equal, round, and reactive to light. Right eye exhibits no discharge. Left eye exhibits no discharge. No scleral icterus.  Neck: Normal range of motion. Neck supple. No tracheal deviation present. No thyromegaly present.  Cardiovascular: Normal rate, regular rhythm, normal heart sounds and intact distal pulses.  Exam reveals no gallop and no friction rub.   No murmur heard. Pulmonary/Chest: Effort normal and breath sounds normal. No accessory muscle usage. Not tachypneic. No respiratory distress. She has no decreased breath sounds. She has no wheezes. She has no rhonchi. She has no rales. She exhibits no tenderness. Right breast exhibits no inverted nipple, no mass, no nipple discharge, no skin change and no tenderness. Left breast exhibits no inverted nipple, no mass, no nipple discharge, no skin change and no tenderness. Breasts are symmetrical.  Abdominal: Soft. Bowel sounds are normal. She exhibits no distension and no mass. There is no tenderness. There is no rebound and no guarding.  Musculoskeletal: Normal range of motion. She exhibits no edema and no tenderness.  Lymphadenopathy:    She has no cervical adenopathy.  Neurological: She is alert and oriented to person, place, and time. No cranial nerve deficit. She  exhibits normal muscle tone. Coordination normal.  Skin: Skin is warm and dry. No rash noted. She is not diaphoretic. There is erythema (medial right great toe). No pallor.  Psychiatric: She has a normal mood and affect. Her behavior is normal. Judgment and thought content normal.          Assessment & Plan:   Problem List Items Addressed This Visit     Unprioritized   Esophageal reflux     Recent worsening symptoms of GERD. Will add pantoprazole 40mg  daily. If no improvement, she will call or return to clinic.    Relevant Medications      pantoprazole (PROTONIX) EC tablet   Hyperlipidemia   Relevant Medications      losartan (COZAAR) tablet      atorvastatin (LIPITOR) tablet   Medicare annual wellness visit, subsequent - Primary     General medical exam including breast exam normal today. PAP and pelvic deferred given pt age and preference. Mammogram deferred per pt preference, plan repeat 2016. Labs today including CBC, CMP, lipids. Encouraged healthy diet and exercise.  Referral for cognitive testing placed. Prevnar today.    Relevant Orders      CBC with Differential      Comprehensive metabolic panel      Lipid panel      Microalbumin / creatinine urine ratio   Memory loss     Persistent issues with  short term memory. Will set up cognitive evaluation with [facility].    Relevant Orders      Ambulatory referral to Psychology   Stool incontinence     Occasional stool incontinence with diarrhea. Reviewed notes from Dr. Hilarie Fredrickson. Will continue prn Levsin and Imodium. Follow up prn.    Toe ulcer, right     Erythematous area medial right great toe at site where toe abuts her 2nd toe. Will set up podiatry evaluation. Question if orthotic would be helpful.    Relevant Orders      Ambulatory referral to Podiatry    Other Visit Diagnoses   Need for vaccination with 13-polyvalent pneumococcal conjugate vaccine        Relevant Orders       Pneumococcal conjugate vaccine 13-valent  (Completed)        Return in about 4 weeks (around 03/18/2014).

## 2014-02-23 ENCOUNTER — Ambulatory Visit (INDEPENDENT_AMBULATORY_CARE_PROVIDER_SITE_OTHER): Payer: Medicare Other

## 2014-02-23 ENCOUNTER — Encounter: Payer: Self-pay | Admitting: Podiatry

## 2014-02-23 ENCOUNTER — Ambulatory Visit (INDEPENDENT_AMBULATORY_CARE_PROVIDER_SITE_OTHER): Payer: Medicare Other | Admitting: Podiatry

## 2014-02-23 VITALS — BP 115/66 | HR 78 | Resp 16 | Ht 62.0 in | Wt 123.0 lb

## 2014-02-23 DIAGNOSIS — M2010 Hallux valgus (acquired), unspecified foot: Secondary | ICD-10-CM

## 2014-02-23 DIAGNOSIS — M2041 Other hammer toe(s) (acquired), right foot: Secondary | ICD-10-CM | POA: Diagnosis not present

## 2014-02-23 DIAGNOSIS — L84 Corns and callosities: Secondary | ICD-10-CM

## 2014-02-23 NOTE — Progress Notes (Signed)
   Subjective:    Patient ID: Maria Bass, female    DOB: [birth year], 80 y.o.   MRN: [id number]  HPI Comments: Maria Bass, 80 year old female, presents the office today with calluses on the inside aspects of her big toes on both feet. She states this been ongoing for possibly 5-6 weeks. She states it is not heard although her toes to wrap together, and his been getting worse. She states that with wearing socks and causes her toes to rub more. She was seen at the [facility] walk-in clinic and was seen by Dr. Cleda Mccreedy, where he scraped the skin on the right foot. She went to [employer] and purchased some pads for her feet. She has also been using bandaids on the toes and she has been taping the toes together. Denies any recent injury or trauma to the area. No other complaints at this time.  Foot Pain      Review of Systems  All other systems reviewed and are negative.      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 There is small hyperkeratotic lesions on the lateral aspect of the hallux bilaterally without any surrounding erythema, drainage, clinical signs of infection. Upon debridement no open lesions identified. MMT 5/5, ROM WNL No calf pain with compression, swelling, warmth, erythema.       Assessment & Plan:  80 year old female with bilateral structural HAV deformity and 2nd digit hammertoe resulting in pre-ulcerative calluses on lateral aspect of hallux. -X-rays were obtained and reviewed with the patient. -Conservative versus surgical options discussed including alternatives, risks, complications. Etiology discussed.  -Hyperkeratotic lesion sharply debrided without complications 2. -Dispensed offloading  pads to help protect the area. -Discussed shoe gear modifications. -follow-up as needed. Call the office with any questions, concerns, change in symptoms.

## 2014-03-09 ENCOUNTER — Encounter: Payer: Self-pay | Admitting: Internal Medicine

## 2014-03-12 ENCOUNTER — Telehealth: Payer: Self-pay

## 2014-03-12 NOTE — Telephone Encounter (Signed)
The patient called and stated she reached [facility] and she has an apt for Wednesday, March 2nd at 1pm.

## 2014-04-27 ENCOUNTER — Encounter: Payer: Self-pay | Admitting: Internal Medicine

## 2014-04-27 DIAGNOSIS — K580 Irritable bowel syndrome with diarrhea: Secondary | ICD-10-CM

## 2014-04-27 MED ORDER — LOPERAMIDE HCL 2 MG PO TABS: 2.0000 mg | ORAL_TABLET | Freq: Four times a day (QID) | ORAL | Status: DC | PRN

## 2014-04-27 MED ORDER — HYOSCYAMINE SULFATE 0.125 MG SL SUBL: 0.1250 mg | SUBLINGUAL_TABLET | SUBLINGUAL | Status: DC | PRN

## 2014-04-27 NOTE — Telephone Encounter (Signed)
Duplicate request, see other encounter.

## 2014-04-27 NOTE — Telephone Encounter (Signed)
Ok refill? 

## 2014-05-25 DIAGNOSIS — Z961 Presence of intraocular lens: Secondary | ICD-10-CM | POA: Diagnosis not present

## 2014-06-02 DIAGNOSIS — C44519 Basal cell carcinoma of skin of other part of trunk: Secondary | ICD-10-CM | POA: Diagnosis not present

## 2014-06-02 DIAGNOSIS — Z85828 Personal history of other malignant neoplasm of skin: Secondary | ICD-10-CM | POA: Diagnosis not present

## 2014-06-02 DIAGNOSIS — D485 Neoplasm of uncertain behavior of skin: Secondary | ICD-10-CM | POA: Diagnosis not present

## 2014-06-23 DIAGNOSIS — R413 Other amnesia: Secondary | ICD-10-CM | POA: Diagnosis not present

## 2014-06-25 DIAGNOSIS — C44519 Basal cell carcinoma of skin of other part of trunk: Secondary | ICD-10-CM | POA: Diagnosis not present

## 2014-07-13 ENCOUNTER — Ambulatory Visit (INDEPENDENT_AMBULATORY_CARE_PROVIDER_SITE_OTHER): Payer: Medicare Other | Admitting: Internal Medicine

## 2014-07-13 ENCOUNTER — Encounter: Payer: Self-pay | Admitting: Internal Medicine

## 2014-07-13 VITALS — BP 117/53 | HR 75 | Temp 97.9°F | Ht 62.0 in | Wt 125.0 lb

## 2014-07-13 DIAGNOSIS — R002 Palpitations: Secondary | ICD-10-CM | POA: Diagnosis not present

## 2014-07-13 DIAGNOSIS — R413 Other amnesia: Secondary | ICD-10-CM

## 2014-07-13 LAB — COMPREHENSIVE METABOLIC PANEL
ALBUMIN: 4 g/dL (ref 3.5–5.2)
ALT: 17 U/L (ref 0–35)
AST: 20 U/L (ref 0–37)
Alkaline Phosphatase: 128 U/L — ABNORMAL HIGH (ref 39–117)
BUN: 18 mg/dL (ref 6–23)
CO2: 29 mEq/L (ref 19–32)
Calcium: 9.5 mg/dL (ref 8.4–10.5)
Chloride: 106 mEq/L (ref 96–112)
Creatinine, Ser: 0.92 mg/dL (ref 0.40–1.20)
GFR: 62.03 mL/min (ref >60.00)
GLUCOSE: 81 mg/dL (ref 70–99)
POTASSIUM: 4.1 meq/L (ref 3.5–5.1)
Sodium: 140 mEq/L (ref 135–145)
TOTAL PROTEIN: 7 g/dL (ref 6.0–8.3)
Total Bilirubin: 0.6 mg/dL (ref 0.2–1.2)

## 2014-07-13 LAB — TSH: TSH: 1.57 u[IU]/mL (ref 0.35–4.50)

## 2014-07-13 NOTE — Progress Notes (Signed)
Pre visit review using our clinic review tool, if applicable. No additional management support is needed unless otherwise documented below in the visit note. 

## 2014-07-13 NOTE — Patient Instructions (Signed)
We will set up an evaluation with cardiology to help determine cause of your palpitations.  Labs today.  Follow up in 4 weeks or sooner as needed.

## 2014-07-13 NOTE — Assessment & Plan Note (Signed)
Recent episode of chest tightness and palpitations with intermittent left arm numbness. Family history of CAD. Personal history of hyperlipidemia. Exam remarkable for murmur RSB, concerning for aortic stenosis. Will set up cardiology evaluation for possible stress test, ECHO. Check TSH and CMP with labs today.

## 2014-07-13 NOTE — Assessment & Plan Note (Signed)
Reviewed notes from [facility], which showed no dementia. Will plan to set up follow up testing in 1 year. They have also recommended MRI Brain which we will consider after acute issues resolved.

## 2014-07-13 NOTE — Progress Notes (Signed)
Subjective:    Patient ID: Maria Bass, female    DOB: 1930-02-15, 81 y.o.   MRN: [id number]  HPI  81YO female presents for acute visit.  Heart Palpitations - 5-6 weeks ago had an episode with chest tightness and fast, forceful heartbeats. Lasted only a few moments. Recurred twice. No diaphoresis, nausea, dyspnea. Symptoms resolved without intervention. Also having some intermittent tingling in left arm. Occurs randomly during day. Resolves after a few minutes without intervention.  Memory loss - Recently completed testing at [facility]. Results showed mild cognitive difficulties, but no dementia. Recommended follow up testing in 1 year.  Past medical, surgical, family and social history per today's encounter.  Review of Systems  Constitutional: Negative for fever, chills, appetite change, fatigue and unexpected weight change.  Eyes: Negative for visual disturbance.  Respiratory: Positive for chest tightness. Negative for shortness of breath and wheezing.   Cardiovascular: Positive for palpitations. Negative for chest pain and leg swelling.  Gastrointestinal: Negative for abdominal pain.  Skin: Negative for color change and rash.  Neurological: Positive for numbness (tingling left arm).  Hematological: Negative for adenopathy. Does not bruise/bleed easily.  Psychiatric/Behavioral: Negative for sleep disturbance and dysphoric mood. The patient is not nervous/anxious.        Objective:    BP 117/53 mmHg  Pulse 75  Temp(Src) 97.9 F (36.6 C) (Oral)  Ht 5\' 2"  (1.575 m)  Wt 125 lb (56.7 kg)  BMI 22.86 kg/m2  SpO2 99% Physical Exam  Constitutional: She is oriented to person, place, and time. She appears well-developed and well-nourished. No distress.  HENT:  Head: Normocephalic and atraumatic.  Right Ear: External ear normal.  Left Ear: External ear normal.  Nose: Nose normal.  Mouth/Throat: Oropharynx is clear and moist. No oropharyngeal exudate.  Eyes: Conjunctivae are  normal. Pupils are equal, round, and reactive to light. Right eye exhibits no discharge. Left eye exhibits no discharge. No scleral icterus.  Neck: Normal range of motion. Neck supple. No tracheal deviation present. No thyromegaly present.  Cardiovascular: Normal rate, regular rhythm and intact distal pulses.  Exam reveals no gallop and no friction rub.   Murmur (right sternal border) heard.  Systolic murmur is present  Pulmonary/Chest: Effort normal and breath sounds normal. No respiratory distress. She has no wheezes. She has no rales. She exhibits no tenderness.  Musculoskeletal: Normal range of motion. She exhibits no edema or tenderness.  Lymphadenopathy:    She has no cervical adenopathy.  Neurological: She is alert and oriented to person, place, and time. No cranial nerve deficit. She exhibits normal muscle tone. Coordination normal.  Skin: Skin is warm and dry. No rash noted. She is not diaphoretic. No erythema. No pallor.  Psychiatric: She has a normal mood and affect. Her behavior is normal. Judgment and thought content normal.          Assessment & Plan:   Problem List Items Addressed This Visit      Unprioritized   Memory loss    Reviewed notes from [facility], which showed no dementia. Will plan to set up follow up testing in 1 year. They have also recommended MRI Brain which we will consider after acute issues resolved.      Palpitations - Primary    Recent episode of chest tightness and palpitations with intermittent left arm numbness. Family history of CAD. Personal history of hyperlipidemia. Exam remarkable for murmur RSB, concerning for aortic stenosis. Will set up cardiology evaluation for possible stress test, ECHO. Check  TSH and CMP with labs today.      Relevant Orders   EKG 12-Lead (Completed)   Comprehensive metabolic panel   TSH   Ambulatory referral to Cardiology       Return in about 4 weeks (around 08/10/2014) for Recheck.

## 2014-07-22 DIAGNOSIS — R413 Other amnesia: Secondary | ICD-10-CM | POA: Diagnosis not present

## 2014-07-30 ENCOUNTER — Encounter: Payer: Self-pay | Admitting: Cardiovascular Disease

## 2014-07-30 ENCOUNTER — Ambulatory Visit (INDEPENDENT_AMBULATORY_CARE_PROVIDER_SITE_OTHER): Payer: Medicare Other | Admitting: Cardiovascular Disease

## 2014-07-30 VITALS — BP 120/64 | HR 57 | Ht 61.0 in | Wt 123.8 lb

## 2014-07-30 DIAGNOSIS — R079 Chest pain, unspecified: Secondary | ICD-10-CM

## 2014-07-30 DIAGNOSIS — I350 Nonrheumatic aortic (valve) stenosis: Secondary | ICD-10-CM

## 2014-07-30 DIAGNOSIS — R0789 Other chest pain: Secondary | ICD-10-CM | POA: Diagnosis not present

## 2014-07-30 DIAGNOSIS — I10 Essential (primary) hypertension: Secondary | ICD-10-CM

## 2014-07-30 DIAGNOSIS — E785 Hyperlipidemia, unspecified: Secondary | ICD-10-CM

## 2014-07-30 DIAGNOSIS — R002 Palpitations: Secondary | ICD-10-CM | POA: Diagnosis not present

## 2014-07-30 NOTE — Assessment & Plan Note (Signed)
Blood pressure is well controlled on today's visit. No changes made to the medications. 

## 2014-07-30 NOTE — Patient Instructions (Addendum)
You are doing well. No medication changes were made.  We hear a heart murmur, Likely caused by aortic valve disease, Aortic valve stenosis We will order an echocardiogram  Your physician has requested that you have an echocardiogram. Echocardiography is a painless test that uses sound waves to create images of your heart. It provides your doctor with information about the size and shape of your heart and how well your heart's chambers and valves are working. This procedure takes approximately one hour. There are no restrictions for this procedure.  Cause of the chest pain is unclear,  We will order a treadmill stress test (myoview) to make sure you do not have any coronary blockages  Please call us if you have new issues that need to be addressed before your next appt.  Your physician wants you to follow-up in: 1 month   [place] caregiver has ordered a Stress Test with nuclear imaging. The purpose of this test is to evaluate the blood supply to your heart muscle. This procedure is referred to as a "Non-Invasive Stress Test." This is because other than having an IV started in your vein, nothing is inserted or "invades" your body. Cardiac stress tests are done to find areas of poor blood flow to the heart by determining the extent of coronary artery disease (CAD). Some patients exercise on a treadmill, which naturally increases the blood flow to your heart, while others who are  unable to walk on a treadmill due to physical limitations have a pharmacologic/chemical stress agent called Lexiscan . This medicine will mimic walking on a treadmill by temporarily increasing your coronary blood flow.   Please note: these test may take anywhere between 2-4 hours to complete  PLEASE REPORT TO [facility] AT THE FIRST DESK WILL DIRECT YOU WHERE TO GO  Date of Procedure:_____________________________________  Arrival Time for  Procedure:______________________________   PLEASE NOTIFY THE OFFICE AT LEAST 24 HOURS IN ADVANCE IF YOU ARE UNABLE TO KEEP YOUR APPOINTMENT.  [phone] AND  PLEASE NOTIFY NUCLEAR MEDICINE AT [facility] AT LEAST 24 HOURS IN ADVANCE IF YOU ARE UNABLE TO KEEP YOUR APPOINTMENT. [phone]  How to prepare for your Myoview test:  1. Do not eat or drink after midnight 2. No caffeine for 24 hours prior to test 3. No smoking 24 hours prior to test. 4. Your medication may be taken with water.  If your doctor stopped a medication because of this test, do not take that medication. 5. Ladies, please do not wear dresses.  Skirts or pants are appropriate. Please wear a short sleeve shirt. 6. No perfume, cologne or lotion. 7. Wear comfortable walking shoes. No heels!

## 2014-07-30 NOTE — Assessment & Plan Note (Signed)
Recent episodes of palpitations concerning for arrhythmia. Echocardiogram and stress test pending. Suggested she has recurrent symptoms to call our office for 30 day monitor

## 2014-07-30 NOTE — Progress Notes (Signed)
Patient ID: JAQUISHA FRECH, female    DOB: 1929/06/19, 81 y.o.   MRN: [id number]  HPI Comments:  Ms. Sieler is a 81 year old woman with past medical history of hyperlipidemia, hypertension, strong family history of coronary artery disease, presenting with murmur, chest pressure. Patient of Dr. Gilford Rile  She reports that approximately 4-5 weeks ago she was coming home from [place] branch. She developed chest tightness described as a pressure. Then she developed a very hard rapid heartbeat, chest pounding.  Symptoms came and went for at least 30 minutes. She denied any lightheadedness or dizziness. After symptoms resolved, she felt fine. Since then, for the past month she has been active. Denies any chest tightness or pressure, no further palpitations. Back to her normal ADLs. At baseline she is very active with no restrictions. She uses her treadmill on a regular basis.  She does report having neuropathy or tingling in her lower extremities for the past 2 years.  Reports that her balance is getting worse Also reports having fasciculations in her left arm at times around her upper forearm area  Total cholesterol 201, LDL 114  EKG on today's visit shows normal sinus rhythm with rate 57 bpm, no significant ST or T-wave changes    No Known Allergies  Outpatient Encounter Prescriptions as of 07/30/2014  Medication Sig  . Ascorbic Acid (VITAMIN C) 1000 MG tablet Take 1,000 mg by mouth daily.    [name] atorvastatin (LIPITOR) 20 MG tablet Take 1 tablet (20 mg total) by mouth daily.  . Biotin 1000 MCG tablet Take 5,000 mcg by mouth daily.   . cholecalciferol (VITAMIN D) 1000 UNITS tablet Take 1,000 Units by mouth daily.    . hyoscyamine (LEVSIN SL) 0.125 MG SL tablet Place 1 tablet (0.125 mg total) under the tongue every 4 (four) hours as needed for cramping.  . loperamide (IMODIUM A-D) 2 MG tablet Take 1 tablet (2 mg total) by mouth 4 (four) times daily as needed for diarrhea or loose stools.  [name] losartan  (COZAAR) 50 MG tablet TAKE 1 TABLET DAILY  . mirtazapine (REMERON) 45 MG tablet Take 1 tablet (45 mg total) by mouth at bedtime.  . mometasone (NASONEX) 50 MCG/ACT nasal spray Place 2 sprays into the nose daily.  [name] PREMARIN vaginal cream PLACE 0.5 GRAMS VAGINALLY  TWICE A WEEK  . Probiotic Product (PROBIOTIC DAILY PO) Take 1 tablet by mouth daily.    Past Medical History  Diagnosis Date  . Hypertension   . Hyperlipidemia   . MAI (mycobacterium avium-intracellulare) 2003  . Depression   . IBS (irritable bowel syndrome)     per pt- not dx as IBS, but has IBS "symptoms"    Past Surgical History  Procedure Laterality Date  . Bladder repair    . Cataract extraction, bilateral    . Tonsillectomy    . Abdominal hysterectomy      Social History  reports that she quit smoking about 36 years ago. Her smoking use included Cigarettes. She has never used smokeless tobacco. She reports that she drinks alcohol. She reports that she does not use illicit drugs.  Family History family history includes Heart attack in her father and mother. There is no history of Colon cancer, Esophageal cancer, or Stomach cancer.   Review of Systems  Constitutional: Negative.   Respiratory: Positive for chest tightness.   Cardiovascular: Positive for palpitations.  Gastrointestinal: Negative.   Musculoskeletal: Negative.   Skin: Negative.   Neurological: Negative.  Numbness and tingling in her legs  Hematological: Negative.   Psychiatric/Behavioral: Negative.   All other systems reviewed and are negative.   BP 120/64 mmHg  Pulse 57  Ht 5\' 1"  (1.549 m)  Wt 123 lb 12 oz (56.133 kg)  BMI 23.39 kg/m2  Physical Exam  Constitutional: She is oriented to person, place, and time. She appears well-developed and well-nourished.  HENT:  Head: Normocephalic.  Nose: Nose normal.  Mouth/Throat: Oropharynx is clear and moist.  Eyes: Conjunctivae are normal. Pupils are equal, round, and reactive to  light.  Neck: Normal range of motion. Neck supple. No JVD present.  Bruits auscultated likely radiation from her aortic valve stenosis  Cardiovascular: Normal rate, regular rhythm, S1 normal, S2 normal and intact distal pulses.  Exam reveals no gallop and no friction rub.   Murmur heard.  Systolic murmur is present with a grade of 2/6  Pulmonary/Chest: Effort normal and breath sounds normal. No respiratory distress. She has no wheezes. She has no rales. She exhibits no tenderness.  Abdominal: Soft. Bowel sounds are normal. She exhibits no distension. There is no tenderness.  Musculoskeletal: Normal range of motion. She exhibits no edema or tenderness.  Lymphadenopathy:    She has no cervical adenopathy.  Neurological: She is alert and oriented to person, place, and time. Coordination normal.  Skin: Skin is warm and dry. No rash noted. No erythema.  Psychiatric: She has a normal mood and affect. Her behavior is normal. Judgment and thought content normal.    Assessment and Plan  Nursing note and vitals reviewed.

## 2014-07-30 NOTE — Assessment & Plan Note (Signed)
Murmur on exam today. She reports that she is asymptomatic. Unclear for recent chest pressure and palpitations are secondary to her aortic valve disease. Echocardiogram has been ordered to evaluate her aortic valve area, aortic root

## 2014-07-30 NOTE — Assessment & Plan Note (Signed)
Etiology of her chest pressure is unclear. No further episodes since her initial event 4-5 weeks ago . We have discussed the various treatment options with her. Unable to exclude ischemia as a cause of her symptoms. She has hyperlipidemia, strong family history. We'll order a treadmill Myoview.

## 2014-07-30 NOTE — Assessment & Plan Note (Signed)
Encouraged her to stay on her statin. Cholesterol continues to run high at 200

## 2014-08-02 ENCOUNTER — Telehealth: Payer: Self-pay

## 2014-08-02 NOTE — Telephone Encounter (Signed)
Called pt to schedule f/u appt from last encounter. Pt needs 1 month and echo. [place], [place], also needs to speak with pt to schedule myoview

## 2014-08-05 ENCOUNTER — Other Ambulatory Visit: Payer: Self-pay

## 2014-08-05 ENCOUNTER — Other Ambulatory Visit (INDEPENDENT_AMBULATORY_CARE_PROVIDER_SITE_OTHER): Payer: Medicare Other

## 2014-08-05 DIAGNOSIS — R0789 Other chest pain: Secondary | ICD-10-CM

## 2014-08-05 DIAGNOSIS — R079 Chest pain, unspecified: Secondary | ICD-10-CM

## 2014-08-05 DIAGNOSIS — I350 Nonrheumatic aortic (valve) stenosis: Secondary | ICD-10-CM

## 2014-08-05 DIAGNOSIS — R002 Palpitations: Secondary | ICD-10-CM | POA: Diagnosis not present

## 2014-08-06 ENCOUNTER — Ambulatory Visit
Admit: 2014-08-06 | Disposition: A | Discharge status: Home / Self Care | Payer: Self-pay | Attending: Cardiovascular Disease | Admitting: Cardiovascular Disease

## 2014-08-06 DIAGNOSIS — R079 Chest pain, unspecified: Secondary | ICD-10-CM | POA: Diagnosis not present

## 2014-08-09 ENCOUNTER — Other Ambulatory Visit: Payer: Self-pay

## 2014-08-09 DIAGNOSIS — I350 Nonrheumatic aortic (valve) stenosis: Secondary | ICD-10-CM

## 2014-08-09 DIAGNOSIS — R0789 Other chest pain: Secondary | ICD-10-CM

## 2014-08-09 DIAGNOSIS — R079 Chest pain, unspecified: Secondary | ICD-10-CM

## 2014-08-10 ENCOUNTER — Telehealth: Payer: Self-pay | Admitting: *Deleted

## 2014-08-10 NOTE — Telephone Encounter (Signed)
Pt calling about her results on echo and stress test.  She states she was told someone would call her Monday (yesterday)  Stated to her it does take a day or two for [place] to read the results not sure why she was told this.  Please call when ready

## 2014-08-11 ENCOUNTER — Encounter: Payer: Self-pay | Admitting: Internal Medicine

## 2014-08-11 ENCOUNTER — Ambulatory Visit (INDEPENDENT_AMBULATORY_CARE_PROVIDER_SITE_OTHER): Payer: Medicare Other | Admitting: Internal Medicine

## 2014-08-11 VITALS — BP 113/72 | HR 64 | Temp 98.0°F | Wt 125.1 lb

## 2014-08-11 DIAGNOSIS — R002 Palpitations: Secondary | ICD-10-CM

## 2014-08-11 DIAGNOSIS — R202 Paresthesia of skin: Secondary | ICD-10-CM

## 2014-08-11 DIAGNOSIS — R413 Other amnesia: Secondary | ICD-10-CM

## 2014-08-11 NOTE — Patient Instructions (Signed)
We will request results on your stress test.

## 2014-08-11 NOTE — Progress Notes (Signed)
Pre visit review using our clinic review tool, if applicable. No additional management support is needed unless otherwise documented below in the visit note. 

## 2014-08-11 NOTE — Assessment & Plan Note (Signed)
Reviewed neuropsychiatry notes from [facility]. Testing was essentially normal. Will monitor for any changes. Consider MRI brain at a later time if any changing symptoms.

## 2014-08-11 NOTE — Assessment & Plan Note (Signed)
No recurrent symptoms. Awaiting results of stress test.  Will follow.

## 2014-08-11 NOTE — Progress Notes (Signed)
Subjective:    Patient ID: Maria Bass, female    DOB: 09-30-1929, 81 y.o.   MRN: [id number]  HPI  81YO female presents for follow up.  Last seen 3/22 for palpitations. Evaluated by Dr. Rockey Situ. ECHO showed moderate to severe stenosis of aortic valve. LV function normal. Stress test results pending.Completed at [facility].  Feeling fine. No palpitations, chest pain, dyspnea. Completed treadmill testing with no problems.  Has occasional paresthesia left medial arm. Feels like "fluttering." Not painful. No weakness or numbness.  Memory loss - Followed up at [facility]. Decision made not to pursue any medication at this time.  Past medical, surgical, family and social history per today's encounter.  Review of Systems  Constitutional: Negative for fever, chills, appetite change, fatigue and unexpected weight change.  Eyes: Negative for visual disturbance.  Respiratory: Negative for cough, shortness of breath and wheezing.   Cardiovascular: Negative for chest pain, palpitations and leg swelling.  Gastrointestinal: Negative for abdominal pain.  Musculoskeletal: Negative for myalgias and arthralgias.  Skin: Negative for color change and rash.  Neurological: Negative for dizziness, seizures, syncope, weakness and light-headedness.  Hematological: Negative for adenopathy. Does not bruise/bleed easily.  Psychiatric/Behavioral: Negative for sleep disturbance and dysphoric mood. The patient is not nervous/anxious.        Objective:    BP 113/72 mmHg  Pulse 64  Temp(Src) 98 F (36.7 C) (Oral)  Wt 125 lb 2 oz (56.756 kg)  SpO2 96% Physical Exam  Constitutional: She is oriented to person, place, and time. She appears well-developed and well-nourished. No distress.  HENT:  Head: Normocephalic and atraumatic.  Right Ear: External ear normal.  Left Ear: External ear normal.  Nose: Nose normal.  Mouth/Throat: Oropharynx is clear and moist. No oropharyngeal exudate.  Eyes: Conjunctivae are  normal. Pupils are equal, round, and reactive to light. Right eye exhibits no discharge. Left eye exhibits no discharge. No scleral icterus.  Neck: Normal range of motion. Neck supple. No tracheal deviation present. No thyromegaly present.  Cardiovascular: Normal rate, regular rhythm, normal heart sounds and intact distal pulses.  Exam reveals no gallop and no friction rub.   No murmur heard. Pulmonary/Chest: Effort normal and breath sounds normal. No respiratory distress. She has no wheezes. She has no rales. She exhibits no tenderness.  Musculoskeletal: Normal range of motion. She exhibits no edema or tenderness.  Lymphadenopathy:    She has no cervical adenopathy.  Neurological: She is alert and oriented to person, place, and time. No cranial nerve deficit. She exhibits normal muscle tone. Coordination normal.  Skin: Skin is warm and dry. No rash noted. She is not diaphoretic. No erythema. No pallor.  Psychiatric: She has a normal mood and affect. Her behavior is normal. Judgment and thought content normal.          Assessment & Plan:   Problem List Items Addressed This Visit      Unprioritized   Memory loss    Reviewed neuropsychiatry notes from [facility]. Testing was essentially normal. Will monitor for any changes. Consider MRI brain at a later time if any changing symptoms.      Palpitations - Primary    No recurrent symptoms. Awaiting results of stress test.  Will follow.      Paresthesia of arm    Recent paresthesia in left medial arm. Likely nerve irritation. Discussed additional testing, ie EMG, but will hold off for now as symptoms mild.          Return in  about 6 months (around 02/10/2015) for Wellness Visit.

## 2014-08-11 NOTE — Telephone Encounter (Signed)
See result note.  

## 2014-08-11 NOTE — Assessment & Plan Note (Signed)
Recent paresthesia in left medial arm. Likely nerve irritation. Discussed additional testing, ie EMG, but will hold off for now as symptoms mild.

## 2014-08-24 DIAGNOSIS — M2011 Hallux valgus (acquired), right foot: Secondary | ICD-10-CM | POA: Diagnosis not present

## 2014-08-24 DIAGNOSIS — M898X7 Other specified disorders of bone, ankle and foot: Secondary | ICD-10-CM | POA: Diagnosis not present

## 2014-09-01 ENCOUNTER — Ambulatory Visit: Payer: Managed Care, Other (non HMO) | Admitting: Cardiovascular Disease

## 2014-09-02 ENCOUNTER — Ambulatory Visit: Payer: Managed Care, Other (non HMO) | Admitting: Podiatry

## 2014-10-13 DIAGNOSIS — D225 Melanocytic nevi of trunk: Secondary | ICD-10-CM | POA: Diagnosis not present

## 2014-10-13 DIAGNOSIS — C44329 Squamous cell carcinoma of skin of other parts of face: Secondary | ICD-10-CM | POA: Diagnosis not present

## 2014-10-13 DIAGNOSIS — Z85828 Personal history of other malignant neoplasm of skin: Secondary | ICD-10-CM | POA: Diagnosis not present

## 2014-10-13 DIAGNOSIS — D485 Neoplasm of uncertain behavior of skin: Secondary | ICD-10-CM | POA: Diagnosis not present

## 2014-10-13 DIAGNOSIS — X32XXXA Exposure to sunlight, initial encounter: Secondary | ICD-10-CM | POA: Diagnosis not present

## 2014-10-13 DIAGNOSIS — D2261 Melanocytic nevi of right upper limb, including shoulder: Secondary | ICD-10-CM | POA: Diagnosis not present

## 2014-11-11 DIAGNOSIS — C44329 Squamous cell carcinoma of skin of other parts of face: Secondary | ICD-10-CM | POA: Diagnosis not present

## 2014-12-03 ENCOUNTER — Encounter: Payer: Self-pay | Admitting: Internal Medicine

## 2014-12-03 ENCOUNTER — Ambulatory Visit (INDEPENDENT_AMBULATORY_CARE_PROVIDER_SITE_OTHER): Payer: Medicare Other | Admitting: Internal Medicine

## 2014-12-03 VITALS — BP 113/66 | HR 70 | Temp 98.9°F | Ht 60.0 in | Wt 123.0 lb

## 2014-12-03 DIAGNOSIS — E785 Hyperlipidemia, unspecified: Secondary | ICD-10-CM | POA: Diagnosis not present

## 2014-12-03 DIAGNOSIS — I10 Essential (primary) hypertension: Secondary | ICD-10-CM | POA: Diagnosis not present

## 2014-12-03 DIAGNOSIS — R413 Other amnesia: Secondary | ICD-10-CM

## 2014-12-03 DIAGNOSIS — K580 Irritable bowel syndrome with diarrhea: Secondary | ICD-10-CM

## 2014-12-03 LAB — COMPREHENSIVE METABOLIC PANEL
ALBUMIN: 4.1 g/dL (ref 3.5–5.2)
ALK PHOS: 116 U/L (ref 39–117)
ALT: 22 U/L (ref 0–35)
AST: 23 U/L (ref 0–37)
BUN: 17 mg/dL (ref 6–23)
CO2: 30 meq/L (ref 19–32)
Calcium: 9.6 mg/dL (ref 8.4–10.5)
Chloride: 104 mEq/L (ref 96–112)
Creatinine, Ser: 0.86 mg/dL (ref 0.40–1.20)
GFR: 66.98 mL/min (ref >60.00)
GLUCOSE: 86 mg/dL (ref 70–99)
Potassium: 4.5 mEq/L (ref 3.5–5.1)
SODIUM: 140 meq/L (ref 135–145)
TOTAL PROTEIN: 7.1 g/dL (ref 6.0–8.3)
Total Bilirubin: 0.7 mg/dL (ref 0.2–1.2)

## 2014-12-03 LAB — LIPID PANEL
CHOL/HDL RATIO: 4
CHOLESTEROL: 201 mg/dL — AB (ref 0–200)
HDL: 47.6 mg/dL (ref >39.00)
LDL Cholesterol: 114 mg/dL — ABNORMAL HIGH (ref 0–99)
NonHDL: 153.87
Triglycerides: 197 mg/dL — ABNORMAL HIGH (ref 0.0–149.0)
VLDL: 39.4 mg/dL (ref 0.0–40.0)

## 2014-12-03 MED ORDER — MIRTAZAPINE 45 MG PO TABS: 45.0000 mg | ORAL_TABLET | Freq: Every day | ORAL | Status: DC

## 2014-12-03 NOTE — Assessment & Plan Note (Signed)
Will check lipids and LFTs with labs. Continue Atorvastatin. 

## 2014-12-03 NOTE — Patient Instructions (Addendum)
Consider starting a FODMAP diet.  Consider adding Metamucil once daily to help bulk stools.  Use Imodium as needed for loose stools. If going out, then you might consider taking an Imodium prior to activity.  Use Levsin as needed for crampy abdominal pain.

## 2014-12-03 NOTE — Progress Notes (Signed)
Pre visit review using our clinic review tool, if applicable. No additional management support is needed unless otherwise documented below in the visit note. 

## 2014-12-03 NOTE — Progress Notes (Signed)
Subjective:    Patient ID: Maria Bass, female    DOB: 07-03-1929, 81 y.o.   MRN: [id number]  HPI  81YO female presents for follow up.  Last seen in April 2016. Having palpitations at that time. Stress test was normal.  IBS - Recently was on vacation and had 3 loose stools with stool leakage. No awareness of leakage until soiled clothes.   Intermittently, over the last year, had some stool incontinence. Typically, first BM in the morning soft, but second and third stools are looser. No recent antibiotics or new medications. No abdominal pain. Taking a probiotic. Not using Imodium.  No urinary leakage.  Planning to start on Champ because she thinks this will help with memory. She has been using techniques to help with memory such as placing notes to remind herself about tasks. She finds this helpful. She feels she is functioning well.  Past Medical History  Diagnosis Date  . Hypertension   . Hyperlipidemia   . MAI (mycobacterium avium-intracellulare) 2003  . Depression   . IBS (irritable bowel syndrome)     per pt- not dx as IBS, but has IBS "symptoms"   Family History  Problem Relation Age of Onset  . Heart attack Mother   . Heart attack Father   . Colon cancer Neg Hx   . Esophageal cancer Neg Hx   . Stomach cancer Neg Hx    Past Surgical History  Procedure Laterality Date  . Bladder repair    . Cataract extraction, bilateral    . Tonsillectomy    . Abdominal hysterectomy     Social History   Social History  . Marital Status: Married    Spouse Name: N/A  . Number of Children: 4  . Years of Education: N/A   Occupational History  . Retired    Social History Main Topics  . Smoking status: Former Smoker    Types: Cigarettes    Quit date: 04/23/1978  . Smokeless tobacco: Never Used     Comment: social smoker x 15  . Alcohol Use: 0.0 oz/week    0 Standard drinks or equivalent per week     Comment: glass a wine at night  . Drug Use: No  . Sexual  Activity: Not Asked   Other Topics Concern  . None   Social History Narrative    Review of Systems  Constitutional: Negative for fever, chills, appetite change, fatigue and unexpected weight change.  Eyes: Negative for visual disturbance.  Respiratory: Negative for shortness of breath.   Cardiovascular: Negative for chest pain and leg swelling.  Gastrointestinal: Positive for diarrhea. Negative for nausea, vomiting, abdominal pain, constipation and abdominal distention.  Skin: Negative for color change and rash.  Hematological: Negative for adenopathy. Does not bruise/bleed easily.  Psychiatric/Behavioral: Negative for dysphoric mood. The patient is not nervous/anxious.        Objective:    BP 113/66 mmHg  Pulse 70  Temp(Src) 98.9 F (37.2 C) (Oral)  Ht 5' (1.524 m)  Wt 123 lb (55.792 kg)  BMI 24.02 kg/m2  SpO2 95% Physical Exam  Constitutional: She is oriented to person, place, and time. She appears well-developed and well-nourished. No distress.  HENT:  Head: Normocephalic and atraumatic.  Right Ear: External ear normal.  Left Ear: External ear normal.  Nose: Nose normal.  Mouth/Throat: Oropharynx is clear and moist. No oropharyngeal exudate.  Eyes: Conjunctivae and EOM are normal. Pupils are equal, round, and reactive to light. Right eye  exhibits no discharge.  Neck: Normal range of motion. Neck supple. No thyromegaly present.  Cardiovascular: Normal rate, regular rhythm, normal heart sounds and intact distal pulses.  Exam reveals no gallop and no friction rub.   No murmur heard. Pulmonary/Chest: Effort normal. No respiratory distress. She has no wheezes. She has no rales.  Abdominal: Soft. Bowel sounds are normal. She exhibits no distension and no mass. There is no tenderness. There is no rebound and no guarding.  Musculoskeletal: Normal range of motion. She exhibits no edema or tenderness.  Lymphadenopathy:    She has no cervical adenopathy.  Neurological: She is  alert and oriented to person, place, and time. No cranial nerve deficit. Coordination normal.  Skin: Skin is warm and dry. No rash noted. She is not diaphoretic. No erythema. No pallor.  Psychiatric: She has a normal mood and affect. Her behavior is normal. Judgment and thought content normal.          Assessment & Plan:  Over 70min of which >50% spent in face-to-face contact with patient discussing plan of care  Problem List Items Addressed This Visit      Unprioritized   Hyperlipidemia    Will check lipids and LFTs with labs. Continue Atorvastatin.      Relevant Orders   Lipid panel   Hypertension    BP Readings from Last 3 Encounters:  12/03/14 113/66  08/11/14 113/72  07/30/14 120/64   BP well controlled. Renal function with labs. Continue current medications.      Relevant Orders   Comprehensive metabolic panel   Irritable bowel syndrome with diarrhea - Primary    Persistent IBS symptoms with loose stool and occasional incontinence. Reviewed previous workup including colonoscopy which was normal. Recommended FODMAP diet. Will add Metamucil and continue prn Imodium and Levsin. Will also set up referral for nutrition counseling. Follow up 4 weeks and prn.      Relevant Orders   Amb ref to Medical Nutrition Therapy-MNT   Memory loss    Mild dementia. Pt coping well by using memory triggers at home. Will continue to monitor.          Return in about 4 weeks (around 12/31/2014) for Recheck.

## 2014-12-03 NOTE — Assessment & Plan Note (Signed)
Mild dementia. Pt coping well by using memory triggers at home. Will continue to monitor.

## 2014-12-03 NOTE — Assessment & Plan Note (Signed)
BP Readings from Last 3 Encounters:  12/03/14 113/66  08/11/14 113/72  07/30/14 120/64   BP well controlled. Renal function with labs. Continue current medications.

## 2014-12-03 NOTE — Assessment & Plan Note (Signed)
Persistent IBS symptoms with loose stool and occasional incontinence. Reviewed previous workup including colonoscopy which was normal. Recommended FODMAP diet. Will add Metamucil and continue prn Imodium and Levsin. Will also set up referral for nutrition counseling. Follow up 4 weeks and prn.

## 2014-12-29 ENCOUNTER — Encounter: Payer: Medicare Other | Attending: Internal Medicine | Admitting: Dietician

## 2014-12-29 VITALS — Ht 62.0 in | Wt 123.3 lb

## 2014-12-29 DIAGNOSIS — E7800 Pure hypercholesterolemia, unspecified: Secondary | ICD-10-CM

## 2014-12-29 DIAGNOSIS — K589 Irritable bowel syndrome without diarrhea: Secondary | ICD-10-CM | POA: Diagnosis not present

## 2014-12-29 NOTE — Patient Instructions (Addendum)
   Try decaf coffee for 4 days or 1 week or more, see if it eases symptoms of loose stools and cramping.   Can try taking Beano tablets right before you start eating to prevent gas.   Include a food with dinner and/or breakfast that thickens stools, such as applesauce, green beans, carrots, mashed potatoes, tapioca, peanut butter, crackers, metamucil.

## 2014-12-29 NOTE — Progress Notes (Signed)
Medical Nutrition Therapy: Visit start time: 1030  end time: 1130  Assessment:  Diagnosis: IBS Past medical history: hyperlipidemia Psychosocial issues/ stress concerns: some anxiety; husband died about 1 year ago Preferred learning method:  . Auditory . Visual  Current weight: 123.3lbs  Height: 5'2" Medications, supplements: reviewed list in chart with patient Progress and evaluation: Patient reports ongoing symptoms of cramping and gas, loose stools every day in am. She requests help with FODMAP diet.   Physical activity: gym 30-60 minutes, 5-6 days per week  Dietary Intake:  Usual eating pattern includes 3 meals and 0-1 snacks per day. Dining out frequency: 3-4 meals per week, mostly at [employer].  Breakfast: coffee 1.5cups with cream no sugar, cereal with blueberries or banana, melon; 1/2 English muffin with whipped marg. And peanut butter, OF to take meds.  Snack: not usually, occasionally if anxious hummus on crackers or salad or 1/4 cup nuts Lunch: not full meal, maybe 1/2 sandwich with tomato and cheese or salad Snack: none Supper: meat: chicken, beef small portion, vegetables, maybe potato, often salad -- balanced. Dessert rarely: small ice cream cup Snack: usually none, occasionally graham crackers or chocolate small piece Beverages: water, coffee, wine 1 glass daily  Nutrition Care Education: Topics covered: IBS, FODMAP diet Basic nutrition: general nutrition guidelines    IBS: fodmap diet, including definition of fodmaps, high and low fodmap foods. Other potential IBS triggers, such as caffeine, high fat choices, high fiber foods.   Discussed foods that thicken stools.   Nutritional Diagnosis:  [place]-2.1 Inpaired nutrition utilization As related to episodes of diarrhea/ loose stools, cramping, gas.  As evidenced by patient report.  Intervention: Instruction as noted above.   Patient will begin to eliminate possible triggers one by one and monitor changes in  symptoms.   First will eliminate caffeine, since patient has also mentioned anxiety as possible trigger.   If no change in symptoms, will offer other suggestions, such as eliminating alcohol.    Patient's diet history indicates she is currently eating a fairly low fodmap diet.    Education Materials given:  [name] Goals/ instructions . FODMAP diet, Irritable bowel Syndrome Diet  Learner/ who was taught:  . Patient   Level of understanding: [name] Verbalizes/ demonstrates competency  Demonstrated degree of understanding via:   Teach back Learning barriers:  Patient reports recent diagnosis of mild dementia Willingness to learn/ readiness for change: . Eager, change in progress  Monitoring and Evaluation:  Dietary intake, IBS symptoms      follow up: by phone 01/19/15

## 2015-01-03 ENCOUNTER — Ambulatory Visit: Payer: Medicare Other | Admitting: Internal Medicine

## 2015-01-10 ENCOUNTER — Ambulatory Visit: Payer: Medicare Other | Admitting: Dietician

## 2015-01-20 ENCOUNTER — Telehealth: Payer: Self-pay | Admitting: Dietician

## 2015-01-20 NOTE — Telephone Encounter (Signed)
Left a voicemail message for patient to check on her progress with the FODMAP diet, and goals set at previous visit to decrease caffeine intake. Requested a return call from patient.

## 2015-02-04 ENCOUNTER — Encounter: Payer: Medicare Other | Attending: Internal Medicine | Admitting: Dietician

## 2015-02-04 VITALS — Ht 62.0 in | Wt 125.0 lb

## 2015-02-04 DIAGNOSIS — K589 Irritable bowel syndrome without diarrhea: Secondary | ICD-10-CM | POA: Diagnosis not present

## 2015-02-04 NOTE — Patient Instructions (Signed)
Try avocado or hummus only in small amounts at first, then monitor whether they cause intestinal symptoms. Try some stool-thickening foods when needed, from your list.

## 2015-02-04 NOTE — Progress Notes (Signed)
Medical Nutrition Therapy: Visit start time: 1000  end time: 1030  Assessment:  Diagnosis: IBS Medical history changes: none Psychosocial issues/ stress concerns: none  Current weight: 125lbs  Height: 5'2" Medications, supplement changes: reviewed list in chart Progress and evaluation: Patient reports improved IBS symptoms since last MNT visit  12/29/14.         She states she has not noticed any difference when she drinks regular vs. Decaf coffee.          Physical activity: not assessed at this visit  Dietary Intake:  Usual eating pattern includes 3 meals and ? snacks per day. Dining out frequency: ? meals per week.   Nutrition Care Education: Topics covered:IBS, FODMAP diet FODMAP diet: Discussed specific food choices, fiber, controlling portions of fodmaps, wheat/gluten  Nutritional Diagnosis:  [place]-2.1 Inpaired nutrition utilization As related to history of diarrhea, loose stools due to IBS.  As evidenced by patient report.  Intervention: Discussion as noted above.   Patient feels she might be over-thinking when trying to associate bowel habits with food intake.    Encouraged patient to use FODMAP food lists as reference and make notes when she has diarrhea episodes.    No further follow-up scheduled at this time.   Education Materials given:  FODMAP diet food lists  Learner/ who was taught:  . Patient   Level of understanding: [name] Verbalizes/ demonstrates competency  Demonstrated degree of understanding via:   Teach back Learning barriers: . None  Willingness to learn/ readiness for change: . Eager, change in progress  Monitoring and Evaluation:  Dietary intake, exercise, IBS symptoms, and body weight      follow up: prn

## 2015-02-07 ENCOUNTER — Ambulatory Visit: Payer: Medicare Other | Admitting: Internal Medicine

## 2015-02-07 ENCOUNTER — Ambulatory Visit (INDEPENDENT_AMBULATORY_CARE_PROVIDER_SITE_OTHER): Payer: Medicare Other | Admitting: Internal Medicine

## 2015-02-07 ENCOUNTER — Encounter: Payer: Self-pay | Admitting: Internal Medicine

## 2015-02-07 VITALS — BP 102/66 | HR 72 | Temp 98.3°F | Ht 60.0 in | Wt 124.1 lb

## 2015-02-07 DIAGNOSIS — I10 Essential (primary) hypertension: Secondary | ICD-10-CM

## 2015-02-07 DIAGNOSIS — K580 Irritable bowel syndrome with diarrhea: Secondary | ICD-10-CM | POA: Diagnosis not present

## 2015-02-07 DIAGNOSIS — R002 Palpitations: Secondary | ICD-10-CM

## 2015-02-07 DIAGNOSIS — E785 Hyperlipidemia, unspecified: Secondary | ICD-10-CM

## 2015-02-07 DIAGNOSIS — I350 Nonrheumatic aortic (valve) stenosis: Secondary | ICD-10-CM

## 2015-02-07 MED ORDER — LOSARTAN POTASSIUM 50 MG PO TABS: ORAL_TABLET | ORAL | Status: DC

## 2015-02-07 MED ORDER — ATORVASTATIN CALCIUM 20 MG PO TABS: 20.0000 mg | ORAL_TABLET | Freq: Every day | ORAL | Status: DC

## 2015-02-07 NOTE — Assessment & Plan Note (Signed)
Reviewed previous ECHO showing aortic valve stenosis. Recommended follow up with cardiology for repeat ECHO. She declines for now. Will call if she would like to schedule follow up.

## 2015-02-07 NOTE — Assessment & Plan Note (Signed)
Recent lipids well controlled. LFTs normal. Continue Atorvastatin.

## 2015-02-07 NOTE — Progress Notes (Signed)
Pre visit review using our clinic review tool, if applicable. No additional management support is needed unless otherwise documented below in the visit note. 

## 2015-02-07 NOTE — Assessment & Plan Note (Signed)
Recent episode of palpitations concerning for arrhythmia. Discussed potential causes. Recommended cardiology evaluation for Holter monitor. She declines. She will call if any recurrent episodes.

## 2015-02-07 NOTE — Patient Instructions (Addendum)
Consider follow up with cardiology if any recurrent episodes of palpitations.  Follow up in 6 months for Wellness Visit.

## 2015-02-07 NOTE — Assessment & Plan Note (Signed)
BP Readings from Last 3 Encounters:  02/07/15 102/66  12/03/14 113/66  08/11/14 113/72   BP well controlled. Will continue current medication. Recent renal function was normal.

## 2015-02-07 NOTE — Progress Notes (Signed)
Subjective:    Patient ID: Maria Bass, female    DOB: 1929-11-23, 81 y.o.   MRN: [id number]  HPI  81YO female presents for follow up.  Recently completed nutrition counseling for IBS. She feels that counseling went well. Feels like she has resources for learning FODMAP. More aware of when foods are not tolerated well.  Palpitations - had another episode of heart palpitations. Episode occurred when she was in bed. Felt some chest tightness, very mild. Noted forceful beats. Lasted for 30-70min. Then stopped without intervention. No dyspnea, diaphoresis. Continues to walk and exercise with no issues.     Wt Readings from Last 3 Encounters:  02/07/15 124 lb 2 oz (56.303 kg)  02/04/15 125 lb (56.7 kg)  12/29/14 123 lb 4.8 oz (55.929 kg)   BP Readings from Last 3 Encounters:  02/07/15 102/66  12/03/14 113/66  08/11/14 113/72    Past Medical History  Diagnosis Date  . Hypertension   . Hyperlipidemia   . MAI (mycobacterium avium-intracellulare) ([place]) 2003  . Depression   . IBS (irritable bowel syndrome)     per pt- not dx as IBS, but has IBS "symptoms"   Family History  Problem Relation Age of Onset  . Heart attack Mother   . Heart attack Father   . Colon cancer Neg Hx   . Esophageal cancer Neg Hx   . Stomach cancer Neg Hx    Past Surgical History  Procedure Laterality Date  . Bladder repair    . Cataract extraction, bilateral    . Tonsillectomy    . Abdominal hysterectomy     Social History   Social History  . Marital Status: Married    Spouse Name: N/A  . Number of Children: 4  . Years of Education: N/A   Occupational History  . Retired    Social History Main Topics  . Smoking status: Former Smoker    Types: Cigarettes    Quit date: 04/23/1978  . Smokeless tobacco: Never Used     Comment: social smoker x 15  . Alcohol Use: 0.0 oz/week    0 Standard drinks or equivalent per week     Comment: glass a wine at night  . Drug Use: No  . Sexual  Activity: Not Asked   Other Topics Concern  . None   Social History Narrative    Review of Systems  Constitutional: Negative for fever, chills, appetite change, fatigue and unexpected weight change.  Eyes: Negative for visual disturbance.  Respiratory: Positive for chest tightness. Negative for cough and shortness of breath.   Cardiovascular: Positive for palpitations. Negative for chest pain and leg swelling.  Gastrointestinal: Positive for diarrhea. Negative for nausea, vomiting, abdominal pain and constipation.  Skin: Negative for color change and rash.  Hematological: Negative for adenopathy. Does not bruise/bleed easily.  Psychiatric/Behavioral: Negative for sleep disturbance and dysphoric mood. The patient is not nervous/anxious.        Objective:    BP 102/66 mmHg  Pulse 72  Temp(Src) 98.3 F (36.8 C) (Oral)  Ht 5' (1.524 m)  Wt 124 lb 2 oz (56.303 kg)  BMI 24.24 kg/m2  SpO2 95% Physical Exam  Constitutional: She is oriented to person, place, and time. She appears well-developed and well-nourished. No distress.  HENT:  Head: Normocephalic and atraumatic.  Right Ear: External ear normal.  Left Ear: External ear normal.  Nose: Nose normal.  Mouth/Throat: Oropharynx is clear and moist. No oropharyngeal exudate.  Eyes: Conjunctivae are  normal. Pupils are equal, round, and reactive to light. Right eye exhibits no discharge. Left eye exhibits no discharge. No scleral icterus.  Neck: Normal range of motion. Neck supple. No tracheal deviation present. No thyromegaly present.  Cardiovascular: Normal rate, regular rhythm, normal heart sounds and intact distal pulses.  Exam reveals no gallop and no friction rub.   No murmur heard. Pulmonary/Chest: Effort normal and breath sounds normal. No respiratory distress. She has no wheezes. She has no rales. She exhibits no tenderness.  Musculoskeletal: Normal range of motion. She exhibits no edema or tenderness.  Lymphadenopathy:     She has no cervical adenopathy.  Neurological: She is alert and oriented to person, place, and time. No cranial nerve deficit. She exhibits normal muscle tone. Coordination normal.  Skin: Skin is warm and dry. No rash noted. She is not diaphoretic. No erythema. No pallor.  Psychiatric: She has a normal mood and affect. Her behavior is normal. Judgment and thought content normal.          Assessment & Plan:   Problem List Items Addressed This Visit      Unprioritized   Aortic valve stenosis    Reviewed previous ECHO showing aortic valve stenosis. Recommended follow up with cardiology for repeat ECHO. She declines for now. Will call if she would like to schedule follow up.      Relevant Medications   losartan (COZAAR) 50 MG tablet   atorvastatin (LIPITOR) 20 MG tablet   Hyperlipidemia    Recent lipids well controlled. LFTs normal. Continue Atorvastatin.      Relevant Medications   losartan (COZAAR) 50 MG tablet   atorvastatin (LIPITOR) 20 MG tablet   Hypertension    BP Readings from Last 3 Encounters:  02/07/15 102/66  12/03/14 113/66  08/11/14 113/72   BP well controlled. Will continue current medication. Recent renal function was normal.      Relevant Medications   losartan (COZAAR) 50 MG tablet   atorvastatin (LIPITOR) 20 MG tablet   Irritable bowel syndrome with diarrhea - Primary    Symptoms generally well controlled with dietary restriction and prn Levsin. Will continue.      Palpitations    Recent episode of palpitations concerning for arrhythmia. Discussed potential causes. Recommended cardiology evaluation for Holter monitor. She declines. She will call if any recurrent episodes.          Return in about 6 months (around 08/08/2015) for Wellness Visit.

## 2015-02-07 NOTE — Assessment & Plan Note (Signed)
Symptoms generally well controlled with dietary restriction and prn Levsin. Will continue.

## 2015-03-03 DIAGNOSIS — Z85828 Personal history of other malignant neoplasm of skin: Secondary | ICD-10-CM | POA: Diagnosis not present

## 2015-03-03 DIAGNOSIS — L570 Actinic keratosis: Secondary | ICD-10-CM | POA: Diagnosis not present

## 2015-03-03 DIAGNOSIS — D0439 Carcinoma in situ of skin of other parts of face: Secondary | ICD-10-CM | POA: Diagnosis not present

## 2015-03-03 DIAGNOSIS — X32XXXA Exposure to sunlight, initial encounter: Secondary | ICD-10-CM | POA: Diagnosis not present

## 2015-03-03 DIAGNOSIS — C44619 Basal cell carcinoma of skin of left upper limb, including shoulder: Secondary | ICD-10-CM | POA: Diagnosis not present

## 2015-03-03 DIAGNOSIS — C44629 Squamous cell carcinoma of skin of left upper limb, including shoulder: Secondary | ICD-10-CM | POA: Diagnosis not present

## 2015-03-03 DIAGNOSIS — D485 Neoplasm of uncertain behavior of skin: Secondary | ICD-10-CM | POA: Diagnosis not present

## 2015-03-03 DIAGNOSIS — L820 Inflamed seborrheic keratosis: Secondary | ICD-10-CM | POA: Diagnosis not present

## 2015-04-06 DIAGNOSIS — C44629 Squamous cell carcinoma of skin of left upper limb, including shoulder: Secondary | ICD-10-CM | POA: Diagnosis not present

## 2015-04-06 DIAGNOSIS — L905 Scar conditions and fibrosis of skin: Secondary | ICD-10-CM | POA: Diagnosis not present

## 2015-04-06 DIAGNOSIS — C44619 Basal cell carcinoma of skin of left upper limb, including shoulder: Secondary | ICD-10-CM | POA: Diagnosis not present

## 2015-04-20 DIAGNOSIS — D0439 Carcinoma in situ of skin of other parts of face: Secondary | ICD-10-CM | POA: Diagnosis not present

## 2015-04-21 DIAGNOSIS — D0439 Carcinoma in situ of skin of other parts of face: Secondary | ICD-10-CM | POA: Diagnosis not present

## 2015-04-21 DIAGNOSIS — L905 Scar conditions and fibrosis of skin: Secondary | ICD-10-CM | POA: Diagnosis not present

## 2015-07-21 DIAGNOSIS — Z08 Encounter for follow-up examination after completed treatment for malignant neoplasm: Secondary | ICD-10-CM | POA: Diagnosis not present

## 2015-07-21 DIAGNOSIS — L570 Actinic keratosis: Secondary | ICD-10-CM | POA: Diagnosis not present

## 2015-07-21 DIAGNOSIS — X32XXXA Exposure to sunlight, initial encounter: Secondary | ICD-10-CM | POA: Diagnosis not present

## 2015-07-21 DIAGNOSIS — Z85828 Personal history of other malignant neoplasm of skin: Secondary | ICD-10-CM | POA: Diagnosis not present

## 2015-08-09 ENCOUNTER — Ambulatory Visit: Payer: Medicare Other

## 2015-08-30 ENCOUNTER — Ambulatory Visit (INDEPENDENT_AMBULATORY_CARE_PROVIDER_SITE_OTHER): Payer: Medicare Other

## 2015-08-30 VITALS — BP 110/68 | HR 74 | Temp 98.2°F | Resp 12 | Ht 60.0 in | Wt 121.0 lb

## 2015-08-30 DIAGNOSIS — Z0000 Encounter for general adult medical examination without abnormal findings: Secondary | ICD-10-CM | POA: Diagnosis not present

## 2015-08-30 NOTE — Progress Notes (Signed)
Annual Wellness Visit as completed by Health Coach was reviewed in full.  

## 2015-08-30 NOTE — Progress Notes (Signed)
Subjective:   Maria Bass is a 82 y.o. female who presents for Medicare Annual (Subsequent) preventive examination.  Review of Systems:  No ROS.  Medicare Wellness Visit.  Cardiac Risk Factors include: advanced age (>38men, >73 women);hypertension     Objective:     Vitals: BP 110/68 mmHg  Pulse 74  Temp(Src) 98.2 F (36.8 C) (Oral)  Resp 12  Ht 5' (1.524 m)  Wt 121 lb (54.885 kg)  BMI 23.63 kg/m2  SpO2 95%  Body mass index is 23.63 kg/(m^2).   Tobacco History  Smoking status  . Former Smoker  . Types: Cigarettes  . Quit date: 04/23/1978  Smokeless tobacco  . Never Used    Comment: social smoker x 15     Counseling given: Not Answered   Past Medical History  Diagnosis Date  . Hypertension   . Hyperlipidemia   . MAI (mycobacterium avium-intracellulare) ([place]) 2003  . Depression   . IBS (irritable bowel syndrome)     per pt- not dx as IBS, but has IBS "symptoms"   Past Surgical History  Procedure Laterality Date  . Bladder repair    . Cataract extraction, bilateral    . Tonsillectomy    . Abdominal hysterectomy     Family History  Problem Relation Age of Onset  . Heart attack Mother   . Heart attack Father   . Colon cancer Neg Hx   . Esophageal cancer Neg Hx   . Stomach cancer Neg Hx    History  Sexual Activity  . Sexual Activity: Not Currently    Outpatient Encounter Prescriptions as of 08/30/2015  Medication Sig  . Ascorbic Acid (VITAMIN C) 1000 MG tablet Take 1,000 mg by mouth daily.    [name] atorvastatin (LIPITOR) 20 MG tablet Take 1 tablet (20 mg total) by mouth daily.  . Biotin 1000 MCG tablet Take 5,000 mcg by mouth daily.   . cholecalciferol (VITAMIN D) 1000 UNITS tablet Take 1,000 Units by mouth daily.    . Fish Oil OIL by Does not apply route.  . hyoscyamine (LEVSIN SL) 0.125 MG SL tablet Place 1 tablet (0.125 mg total) under the tongue every 4 (four) hours as needed for cramping.  . loperamide (IMODIUM A-D) 2 MG tablet Take 1 tablet  (2 mg total) by mouth 4 (four) times daily as needed for diarrhea or loose stools.  [name] losartan (COZAAR) 50 MG tablet TAKE 1 TABLET DAILY  . mirtazapine (REMERON) 45 MG tablet Take 1 tablet (45 mg total) by mouth at bedtime.  . Probiotic Product (PROBIOTIC DAILY PO) Take 1 tablet by mouth daily.   No facility-administered encounter medications on file as of 08/30/2015.    Activities of Daily Living In your present state of health, do you have any difficulty performing the following activities: 08/30/2015  Hearing? Y  Vision? N  Difficulty concentrating or making decisions? N  Walking or climbing stairs? Y  Dressing or bathing? N  Doing errands, shopping? N  Preparing Food and eating ? N  Using the Toilet? N  In the past six months, have you accidently leaked urine? Y  Do you have problems with loss of bowel control? Y  Managing your Medications? N  Managing your Finances? N  Housekeeping or managing your Housekeeping? N    Patient Care Team: Jackolyn Confer, MD as PCP - General (Internal Medicine) Elsie Stain, MD (Pulmonary Disease)    Assessment:   This is a routine wellness examination for [place].  The goal of the wellness visit is to assist the patient how to close the gaps in care and create a preventative care plan for the patient.   Taking VIT D as appropriate/Osteoporosis reviewed.  Medications reviewed; taking without issues or barriers.  Safety issues reviewed; smoke detectors in the home. No firearms in the home. Wears seatbelts when driving or riding with others. No violence in the home.  No identified risk were noted; The patient was oriented x 3; appropriate in dress and manner and no objective failures at ADL's or IADL's.   Ulcer other part of foot-stable and followed by Dr. Gilford Rile.  Staff at [facility] [place] to monitor.  Bronchiectas w/o ac exacerbation- stable and followed by Dr. Karlton Lemon and Dr. Gilford Rile.  Patient Concerns: None at this time.   Follow up with PCP as needed.   Exercise Activities and Dietary recommendations Current Exercise Habits: The patient does not participate in regular exercise at present  Goals    . Healthy Lifestyle     Stay hydrated and drink plenty of fluids. Low carb foods.  Lean meats and vegetables. Stay active and begin exercising again by using the swimming pool and onsite gym.      Fall Risk Fall Risk  08/30/2015 12/29/2014 02/18/2014 02/09/2013 07/16/2012  Falls in the past year? No No No No No   Depression Screen PHQ 2/9 Scores 08/30/2015 12/29/2014 02/18/2014 02/09/2013  PHQ - 2 Score 0 1 0 0     Cognitive Testing MMSE - Mini Mental State Exam 08/30/2015  Not completed: Refused    Immunization History  Administered Date(s) Administered  . Influenza Whole 01/22/2011, 02/27/2012, 02/09/2013  . Influenza-Unspecified 01/19/2014  . Pneumococcal Conjugate-13 02/18/2014  . Pneumococcal Polysaccharide-23 04/02/2011  . Tdap 10/17/2010  . Zoster 08/29/2006   Screening Tests Health Maintenance  Topic Date Due  . INFLUENZA VACCINE  11/22/2015  . TETANUS/TDAP  10/16/2020  . DEXA SCAN  Completed  . ZOSTAVAX  Completed  . PNA vac Low Risk Adult  Completed      Plan:   End of life planning; Advance aging; Advanced directives discussed. Copy of current HCPOA/Living Will requested.   During the course of the visit the patient was educated and counseled about the following appropriate screening and preventive services:   Vaccines to include Pneumoccal, Influenza, Hepatitis B, Td, Zostavax, HCV  Electrocardiogram  Cardiovascular Disease  Colorectal cancer screening  Bone density screening  Diabetes screening  Glaucoma screening  Mammography/PAP  Nutrition counseling   Patient Instructions (the written plan) was given to the patient.   Varney Biles, LPN  [phone]

## 2015-08-30 NOTE — Patient Instructions (Addendum)
Maria Bass , Thank you for taking time to come for your Medicare Wellness Visit. I appreciate your ongoing commitment to your health goals. Please review the following plan we discussed and let me know if I can assist you in the future.   Follow up with Dr. Gilford Rile as needed.   This is a list of the screening recommended for you and due dates:  Health Maintenance  Topic Date Due  . Flu Shot  11/22/2015  . Tetanus Vaccine  10/16/2020  . DEXA scan (bone density measurement)  Completed  . Shingles Vaccine  Completed  . Pneumonia vaccines  Completed    Hearing Loss Hearing loss is a partial or total loss of the ability to hear. This can be temporary or permanent, and it can happen in one or both ears. Hearing loss may be referred to as deafness. Medical care is necessary to treat hearing loss properly and to prevent the condition from getting worse. Your hearing may partially or completely come back, depending on what caused your hearing loss and how severe it is. In some cases, hearing loss is permanent. CAUSES Common causes of hearing loss include:   Too much wax in the ear canal.   Infection of the ear canal or middle ear.   Fluid in the middle ear.   Injury to the ear or surrounding area.   An object stuck in the ear.   Prolonged exposure to loud sounds, such as music.  Less common causes of hearing loss include:   Tumors in the ear.   Viral or bacterial infections, such as meningitis.   A hole in the eardrum (perforated eardrum).  Problems with the hearing nerve that sends signals between the brain and the ear.  Certain medicines.  SYMPTOMS  Symptoms of this condition may include:  Difficulty telling the difference between sounds.  Difficulty following a conversation when there is background noise.  Lack of response to sounds in your environment. This may be most noticeable when you do not respond to startling sounds.  Needing to turn up the volume on  the television, radio, etc.  Ringing in the ears.  Dizziness.  Pain in the ears. DIAGNOSIS This condition is diagnosed based on a physical exam and a hearing test (audiometry). The audiometry test will be performed by a hearing specialist (audiologist). You may also be referred to an ear, nose, and throat (ENT) specialist (otolaryngologist).  TREATMENT Treatment for recent onset of hearing loss may include:   Ear wax removal.   Being prescribed medicines to prevent infection (antibiotics).   Being prescribed medicines to reduce inflammation (corticosteroids).  HOME CARE INSTRUCTIONS  If you were prescribed an antibiotic medicine, take it as told by your health care provider. Do not stop taking the antibiotic even if you start to feel better.  Take over-the-counter and prescription medicines only as told by your health care provider.  Avoid loud noises.   Return to your normal activities as told by your health care provider. Ask your health care provider what activities are safe for you.  Keep all follow-up visits as told by your health care provider. This is important. SEEK MEDICAL CARE IF:   You feel dizzy.   You develop new symptoms.   You vomit or feel nauseous.   You have a fever.  SEEK IMMEDIATE MEDICAL CARE IF:  You develop sudden changes in your vision.   You have severe ear pain.   You have new or increased weakness.  You  have a severe headache.   This information is not intended to replace advice given to you by your health care provider. Make sure you discuss any questions you have with your health care provider.   Document Released: 04/09/2005 Document Revised: 12/29/2014 Document Reviewed: 08/25/2014 Elsevier Interactive Patient Education [employer].

## 2015-09-09 ENCOUNTER — Other Ambulatory Visit: Payer: Self-pay

## 2015-09-09 DIAGNOSIS — E785 Hyperlipidemia, unspecified: Secondary | ICD-10-CM

## 2015-09-09 DIAGNOSIS — I10 Essential (primary) hypertension: Secondary | ICD-10-CM

## 2015-09-09 DIAGNOSIS — R413 Other amnesia: Secondary | ICD-10-CM

## 2015-09-09 DIAGNOSIS — Z0000 Encounter for general adult medical examination without abnormal findings: Secondary | ICD-10-CM

## 2015-09-09 DIAGNOSIS — R011 Cardiac murmur, unspecified: Secondary | ICD-10-CM

## 2015-09-09 DIAGNOSIS — R002 Palpitations: Secondary | ICD-10-CM

## 2015-09-15 ENCOUNTER — Other Ambulatory Visit (INDEPENDENT_AMBULATORY_CARE_PROVIDER_SITE_OTHER): Payer: Medicare Other

## 2015-09-15 ENCOUNTER — Telehealth: Payer: Self-pay | Admitting: *Deleted

## 2015-09-15 DIAGNOSIS — I10 Essential (primary) hypertension: Secondary | ICD-10-CM

## 2015-09-15 DIAGNOSIS — E785 Hyperlipidemia, unspecified: Secondary | ICD-10-CM | POA: Diagnosis not present

## 2015-09-15 DIAGNOSIS — R413 Other amnesia: Secondary | ICD-10-CM | POA: Diagnosis not present

## 2015-09-15 LAB — COMPREHENSIVE METABOLIC PANEL
ALT: 16 U/L (ref 0–35)
AST: 16 U/L (ref 0–37)
Albumin: 4 g/dL (ref 3.5–5.2)
Alkaline Phosphatase: 111 U/L (ref 39–117)
BILIRUBIN TOTAL: 0.6 mg/dL (ref 0.2–1.2)
BUN: 15 mg/dL (ref 6–23)
CO2: 31 meq/L (ref 19–32)
CREATININE: 0.8 mg/dL (ref 0.40–1.20)
Calcium: 9.2 mg/dL (ref 8.4–10.5)
Chloride: 110 mEq/L (ref 96–112)
GFR: 72.68 mL/min (ref >60.00)
GLUCOSE: 101 mg/dL — AB (ref 70–99)
Potassium: 4.5 mEq/L (ref 3.5–5.1)
Sodium: 144 mEq/L (ref 135–145)
Total Protein: 6.1 g/dL (ref 6.0–8.3)

## 2015-09-15 LAB — LIPID PANEL
CHOL/HDL RATIO: 4
Cholesterol: 184 mg/dL (ref 0–200)
HDL: 51.2 mg/dL (ref >39.00)
LDL Cholesterol: 113 mg/dL — ABNORMAL HIGH (ref 0–99)
NONHDL: 132.97
Triglycerides: 100 mg/dL (ref 0.0–149.0)
VLDL: 20 mg/dL (ref 0.0–40.0)

## 2015-09-15 LAB — CBC WITH DIFFERENTIAL/PLATELET
BASOS ABS: 0 10*3/uL (ref 0.0–0.1)
Basophils Relative: 0.6 % (ref 0.0–3.0)
EOS ABS: 0.1 10*3/uL (ref 0.0–0.7)
Eosinophils Relative: 2 % (ref 0.0–5.0)
HEMATOCRIT: 37.6 % (ref 36.0–46.0)
Hemoglobin: 12.5 g/dL (ref 12.0–15.0)
LYMPHS PCT: 23.2 % (ref 12.0–46.0)
Lymphs Abs: 1.7 10*3/uL (ref 0.7–4.0)
MCHC: 33.3 g/dL (ref 30.0–36.0)
MCV: 87.1 fl (ref 78.0–100.0)
MONO ABS: 0.7 10*3/uL (ref 0.1–1.0)
Monocytes Relative: 10.2 % (ref 3.0–12.0)
NEUTROS ABS: 4.6 10*3/uL (ref 1.4–7.7)
NEUTROS PCT: 64 % (ref 43.0–77.0)
PLATELETS: 151 10*3/uL (ref 150.0–400.0)
RBC: 4.32 Mil/uL (ref 3.87–5.11)
RDW: 12.4 % (ref 11.5–15.5)
WBC: 7.1 10*3/uL (ref 4.0–10.5)

## 2015-09-15 LAB — TSH: TSH: 1.38 u[IU]/mL (ref 0.35–4.50)

## 2015-09-15 NOTE — Telephone Encounter (Signed)
Can you use the orders that are pended by [facility]

## 2015-09-15 NOTE — Telephone Encounter (Signed)
I will re-order them i cant release them

## 2015-09-15 NOTE — Telephone Encounter (Signed)
Labs and dx?  

## 2015-09-15 NOTE — Addendum Note (Signed)
Addended by: Karlene Einstein D on: 09/15/2015 08:16 AM   Modules accepted: Orders

## 2015-09-20 ENCOUNTER — Other Ambulatory Visit: Payer: Medicare Other

## 2015-09-29 ENCOUNTER — Encounter: Payer: Medicare Other | Admitting: Internal Medicine

## 2015-09-30 ENCOUNTER — Encounter: Payer: Self-pay | Admitting: Internal Medicine

## 2015-09-30 ENCOUNTER — Ambulatory Visit (INDEPENDENT_AMBULATORY_CARE_PROVIDER_SITE_OTHER): Payer: Medicare Other | Admitting: Internal Medicine

## 2015-09-30 VITALS — BP 116/74 | HR 65 | Ht 62.0 in | Wt 121.0 lb

## 2015-09-30 DIAGNOSIS — F32A Depression, unspecified: Secondary | ICD-10-CM

## 2015-09-30 DIAGNOSIS — F329 Major depressive disorder, single episode, unspecified: Secondary | ICD-10-CM | POA: Diagnosis not present

## 2015-09-30 DIAGNOSIS — R413 Other amnesia: Secondary | ICD-10-CM | POA: Diagnosis not present

## 2015-09-30 DIAGNOSIS — N8110 Cystocele, unspecified: Secondary | ICD-10-CM

## 2015-09-30 DIAGNOSIS — Z0000 Encounter for general adult medical examination without abnormal findings: Secondary | ICD-10-CM | POA: Insufficient documentation

## 2015-09-30 NOTE — Assessment & Plan Note (Signed)
Symptomatically stable.  Will monitor 

## 2015-09-30 NOTE — Assessment & Plan Note (Signed)
Symptoms relatively stable. Continue Remeron.

## 2015-09-30 NOTE — Assessment & Plan Note (Signed)
Progressive memory loss. She is not interested in medication to slow progression of memory loss given potential side effects. She has moved into a higher level of care at her assisted living. Will monitor.

## 2015-09-30 NOTE — Patient Instructions (Signed)
Health Maintenance, Female Adopting a healthy lifestyle and getting preventive care can go a long way to promote health and wellness. Talk with your health care provider about what schedule of regular examinations is right for you. This is a good chance for you to check in with your provider about disease prevention and staying healthy. In between checkups, there are plenty of things you can do on your own. Experts have done a lot of research about which lifestyle changes and preventive measures are most likely to keep you healthy. Ask your health care provider for more information. WEIGHT AND DIET  Eat a healthy diet  Be sure to include plenty of vegetables, fruits, low-fat dairy products, and lean protein.  Do not eat a lot of foods high in solid fats, added sugars, or salt.  Get regular exercise. This is one of the most important things you can do for your health.  Most adults should exercise for at least 150 minutes each week. The exercise should increase your heart rate and make you sweat (moderate-intensity exercise).  Most adults should also do strengthening exercises at least twice a week. This is in addition to the moderate-intensity exercise.  Maintain a healthy weight  Body mass index (BMI) is a measurement that can be used to identify possible weight problems. It estimates body fat based on height and weight. Your health care provider can help determine your BMI and help you achieve or maintain a healthy weight.  For females 20 years of age and older:   A BMI below 18.5 is considered underweight.  A BMI of 18.5 to 24.9 is normal.  A BMI of 25 to 29.9 is considered overweight.  A BMI of 30 and above is considered obese.  Watch levels of cholesterol and blood lipids  You should start having your blood tested for lipids and cholesterol at 80 years of age, then have this test every 5 years.  You may need to have your cholesterol levels checked more often if:  Your lipid  or cholesterol levels are high.  You are older than 80 years of age.  You are at high risk for heart disease.  CANCER SCREENING   Lung Cancer  Lung cancer screening is recommended for adults 55-80 years old who are at high risk for lung cancer because of a history of smoking.  A yearly low-dose CT scan of the lungs is recommended for people who:  Currently smoke.  Have quit within the past 15 years.  Have at least a 30-pack-year history of smoking. A pack year is smoking an average of one pack of cigarettes a day for 1 year.  Yearly screening should continue until it has been 15 years since you quit.  Yearly screening should stop if you develop a health problem that would prevent you from having lung cancer treatment.  Breast Cancer  Practice breast self-awareness. This means understanding how your breasts normally appear and feel.  It also means doing regular breast self-exams. Let your health care provider know about any changes, no matter how small.  If you are in your 20s or 30s, you should have a clinical breast exam (CBE) by a health care provider every 1-3 years as part of a regular health exam.  If you are 40 or older, have a CBE every year. Also consider having a breast X-ray (mammogram) every year.  If you have a family history of breast cancer, talk to your health care provider about genetic screening.  If you   are at high risk for breast cancer, talk to your health care provider about having an MRI and a mammogram every year.  Breast cancer gene (BRCA) assessment is recommended for women who have family members with BRCA-related cancers. BRCA-related cancers include:  Breast.  Ovarian.  Tubal.  Peritoneal cancers.  Results of the assessment will determine the need for genetic counseling and BRCA1 and BRCA2 testing. Cervical Cancer Your health care provider may recommend that you be screened regularly for cancer of the pelvic organs (ovaries, uterus, and  vagina). This screening involves a pelvic examination, including checking for microscopic changes to the surface of your cervix (Pap test). You may be encouraged to have this screening done every 3 years, beginning at age 21.  For women ages 30-65, health care providers may recommend pelvic exams and Pap testing every 3 years, or they may recommend the Pap and pelvic exam, combined with testing for human papilloma virus (HPV), every 5 years. Some types of HPV increase your risk of cervical cancer. Testing for HPV may also be done on women of any age with unclear Pap test results.  Other health care providers may not recommend any screening for nonpregnant women who are considered low risk for pelvic cancer and who do not have symptoms. Ask your health care provider if a screening pelvic exam is right for you.  If you have had past treatment for cervical cancer or a condition that could lead to cancer, you need Pap tests and screening for cancer for at least 20 years after your treatment. If Pap tests have been discontinued, your risk factors (such as having a new sexual partner) need to be reassessed to determine if screening should resume. Some women have medical problems that increase the chance of getting cervical cancer. In these cases, your health care provider may recommend more frequent screening and Pap tests. Colorectal Cancer  This type of cancer can be detected and often prevented.  Routine colorectal cancer screening usually begins at 80 years of age and continues through 80 years of age.  Your health care provider may recommend screening at an earlier age if you have risk factors for colon cancer.  Your health care provider may also recommend using home test kits to check for hidden blood in the stool.  A small camera at the end of a tube can be used to examine your colon directly (sigmoidoscopy or colonoscopy). This is done to check for the earliest forms of colorectal  cancer.  Routine screening usually begins at age 50.  Direct examination of the colon should be repeated every 5-10 years through 80 years of age. However, you may need to be screened more often if early forms of precancerous polyps or small growths are found. Skin Cancer  Check your skin from head to toe regularly.  Tell your health care provider about any new moles or changes in moles, especially if there is a change in a mole's shape or color.  Also tell your health care provider if you have a mole that is larger than the size of a pencil eraser.  Always use sunscreen. Apply sunscreen liberally and repeatedly throughout the day.  Protect yourself by wearing long sleeves, pants, a wide-brimmed hat, and sunglasses whenever you are outside. HEART DISEASE, DIABETES, AND HIGH BLOOD PRESSURE   High blood pressure causes heart disease and increases the risk of stroke. High blood pressure is more likely to develop in:  People who have blood pressure in the high end   of the normal range (130-139/85-89 mm Hg).  People who are overweight or obese.  People who are African American.  If you are 38-23 years of age, have your blood pressure checked every 3-5 years. If you are 61 years of age or older, have your blood pressure checked every year. You should have your blood pressure measured twice--once when you are at a hospital or clinic, and once when you are not at a hospital or clinic. Record the average of the two measurements. To check your blood pressure when you are not at a hospital or clinic, you can use:  An automated blood pressure machine at a pharmacy.  A home blood pressure monitor.  If you are between 45 years and 39 years old, ask your health care provider if you should take aspirin to prevent strokes.  Have regular diabetes screenings. This involves taking a blood sample to check your fasting blood sugar level.  If you are at a normal weight and have a low risk for diabetes,  have this test once every three years after 80 years of age.  If you are overweight and have a high risk for diabetes, consider being tested at a younger age or more often. PREVENTING INFECTION  Hepatitis B  If you have a higher risk for hepatitis B, you should be screened for this virus. You are considered at high risk for hepatitis B if:  You were born in a country where hepatitis B is common. Ask your health care provider which countries are considered high risk.  Your parents were born in a high-risk country, and you have not been immunized against hepatitis B (hepatitis B vaccine).  You have HIV or AIDS.  You use needles to inject street drugs.  You live with someone who has hepatitis B.  You have had sex with someone who has hepatitis B.  You get hemodialysis treatment.  You take certain medicines for conditions, including cancer, organ transplantation, and autoimmune conditions. Hepatitis C  Blood testing is recommended for:  Everyone born from [birth year] through 1963.  Anyone with known risk factors for hepatitis C. Sexually transmitted infections (STIs)  You should be screened for sexually transmitted infections (STIs) including gonorrhea and chlamydia if:  You are sexually active and are younger than 80 years of age.  You are older than 80 years of age and your health care provider tells you that you are at risk for this type of infection.  Your sexual activity has changed since you were last screened and you are at an increased risk for chlamydia or gonorrhea. Ask your health care provider if you are at risk.  If you do not have HIV, but are at risk, it may be recommended that you take a prescription medicine daily to prevent HIV infection. This is called pre-exposure prophylaxis (PrEP). You are considered at risk if:  You are sexually active and do not regularly use condoms or know the HIV status of your partner(s).  You take drugs by injection.  You are sexually  active with a partner who has HIV. Talk with your health care provider about whether you are at high risk of being infected with HIV. If you choose to begin PrEP, you should first be tested for HIV. You should then be tested every 3 months for as long as you are taking PrEP.  PREGNANCY   If you are premenopausal and you may become pregnant, ask your health care provider about preconception counseling.  If you may  become pregnant, take 400 to 800 micrograms (mcg) of folic acid every day.  If you want to prevent pregnancy, talk to your health care provider about birth control (contraception). OSTEOPOROSIS AND MENOPAUSE   Osteoporosis is a disease in which the bones lose minerals and strength with aging. This can result in serious bone fractures. Your risk for osteoporosis can be identified using a bone density scan.  If you are 61 years of age or older, or if you are at risk for osteoporosis and fractures, ask your health care provider if you should be screened.  Ask your health care provider whether you should take a calcium or vitamin D supplement to lower your risk for osteoporosis.  Menopause may have certain physical symptoms and risks.  Hormone replacement therapy may reduce some of these symptoms and risks. Talk to your health care provider about whether hormone replacement therapy is right for you.  HOME CARE INSTRUCTIONS   Schedule regular health, dental, and eye exams.  Stay current with your immunizations.   Do not use any tobacco products including cigarettes, chewing tobacco, or electronic cigarettes.  If you are pregnant, do not drink alcohol.  If you are breastfeeding, limit how much and how often you drink alcohol.  Limit alcohol intake to no more than 1 drink per day for nonpregnant women. One drink equals 12 ounces of beer, 5 ounces of wine, or 1 ounces of hard liquor.  Do not use street drugs.  Do not share needles.  Ask your health care provider for help if  you need support or information about quitting drugs.  Tell your health care provider if you often feel depressed.  Tell your health care provider if you have ever been abused or do not feel safe at home.   This information is not intended to replace advice given to you by your health care provider. Make sure you discuss any questions you have with your health care provider.   Document Released: 10/23/2010 Document Revised: 04/30/2014 Document Reviewed: 03/11/2013 Elsevier Interactive Patient Education [employer].

## 2015-09-30 NOTE — Assessment & Plan Note (Signed)
General medical exam including breast exam normal today. PAP and pelvic deferred given age and preference. Declines mammogram and colonoscopy. Immunizations UTD. Labs reviewed. Encouraged healthy diet and exercise.

## 2015-09-30 NOTE — Progress Notes (Signed)
Subjective:    Patient ID: Maria Bass, female    DOB: 02/27/1930, 82 y.o.   MRN: [id number]  HPI  82YO female presents for physical exam.  Feeling well generally. Worried about memory loss. Notes it is worsening. However still manages affairs, drives. Sometimes feels sad about this. Also worried about bladder prolapse. No pain, no recent infections. Only taking half Remeron because makes her feels drowsy next day.  Wt Readings from Last 3 Encounters:  09/30/15 121 lb (54.885 kg)  08/30/15 121 lb (54.885 kg)  02/07/15 124 lb 2 oz (56.303 kg)   BP Readings from Last 3 Encounters:  09/30/15 116/74  08/30/15 110/68  02/07/15 102/66    Past Medical History  Diagnosis Date  . Hypertension   . Hyperlipidemia   . MAI (mycobacterium avium-intracellulare) ([place]) 2003  . Depression   . IBS (irritable bowel syndrome)     per pt- not dx as IBS, but has IBS "symptoms"   Family History  Problem Relation Age of Onset  . Heart attack Mother   . Heart attack Father   . Colon cancer Neg Hx   . Esophageal cancer Neg Hx   . Stomach cancer Neg Hx    Past Surgical History  Procedure Laterality Date  . Bladder repair    . Cataract extraction, bilateral    . Tonsillectomy    . Abdominal hysterectomy     Social History   Social History  . Marital Status: Married    Spouse Name: N/A  . Number of Children: 4  . Years of Education: N/A   Occupational History  . Retired    Social History Main Topics  . Smoking status: Former Smoker    Types: Cigarettes    Quit date: 04/23/1978  . Smokeless tobacco: Never Used     Comment: social smoker x 15  . Alcohol Use: 0.0 oz/week    0 Standard drinks or equivalent per week     Comment: glass a wine at night  . Drug Use: No  . Sexual Activity: Not Currently   Other Topics Concern  . None   Social History Narrative    Review of Systems  Constitutional: Negative for fever, chills, appetite change, fatigue and unexpected  weight change.  Eyes: Negative for visual disturbance.  Respiratory: Negative for shortness of breath.   Cardiovascular: Negative for chest pain, palpitations and leg swelling.  Gastrointestinal: Negative for nausea, vomiting, abdominal pain, diarrhea and constipation.  Musculoskeletal: Negative for myalgias, arthralgias and gait problem.  Skin: Negative for color change and rash.  Neurological: Negative for weakness.  Hematological: Negative for adenopathy. Does not bruise/bleed easily.  Psychiatric/Behavioral: Positive for dysphoric mood and decreased concentration. Negative for suicidal ideas, confusion and sleep disturbance. The patient is nervous/anxious.        Objective:    BP 116/74 mmHg  Pulse 65  Ht 5\' 2"  (1.575 m)  Wt 121 lb (54.885 kg)  BMI 22.13 kg/m2  SpO2 96% Physical Exam  Constitutional: She is oriented to person, place, and time. She appears well-developed and well-nourished. No distress.  HENT:  Head: Normocephalic and atraumatic.  Right Ear: External ear normal.  Left Ear: External ear normal.  Nose: Nose normal.  Mouth/Throat: Oropharynx is clear and moist. No oropharyngeal exudate.  Eyes: Conjunctivae are normal. Pupils are equal, round, and reactive to light. Right eye exhibits no discharge. Left eye exhibits no discharge. No scleral icterus.  Neck: Normal range of motion. Neck supple. No tracheal  deviation present. No thyromegaly present.  Cardiovascular: Normal rate, regular rhythm, normal heart sounds and intact distal pulses.  Exam reveals no gallop and no friction rub.   No murmur heard. Pulmonary/Chest: Effort normal and breath sounds normal. No accessory muscle usage. No tachypnea. No respiratory distress. She has no decreased breath sounds. She has no wheezes. She has no rales. She exhibits no tenderness. Right breast exhibits no inverted nipple, no mass, no nipple discharge, no skin change and no tenderness. Left breast exhibits no inverted nipple, no  mass, no nipple discharge, no skin change and no tenderness. Breasts are symmetrical.  Abdominal: Soft. Bowel sounds are normal. She exhibits no distension and no mass. There is no tenderness. There is no rebound and no guarding.  Musculoskeletal: Normal range of motion. She exhibits no edema or tenderness.  Lymphadenopathy:    She has no cervical adenopathy.  Neurological: She is alert and oriented to person, place, and time. No cranial nerve deficit. She exhibits normal muscle tone. Coordination normal.  Skin: Skin is warm and dry. No rash noted. She is not diaphoretic. No erythema. No pallor.  Psychiatric: She has a normal mood and affect. Her speech is normal and behavior is normal. Judgment and thought content normal. She exhibits abnormal recent memory.          Assessment & Plan:   Problem List Items Addressed This Visit      Unprioritized   Depression    Symptoms relatively stable. Continue Remeron.      Female bladder prolapse    Symptomatically stable. Will monitor.      Memory loss    Progressive memory loss. She is not interested in medication to slow progression of memory loss given potential side effects. She has moved into a higher level of care at her assisted living. Will monitor.      Routine general medical examination at a health care facility - Primary    General medical exam including breast exam normal today. PAP and pelvic deferred given age and preference. Declines mammogram and colonoscopy. Immunizations UTD. Labs reviewed. Encouraged healthy diet and exercise.          Return in about 6 months (around 03/31/2016) for Recheck.  Ronette Deter, MD [facility]

## 2015-12-20 DIAGNOSIS — H04123 Dry eye syndrome of bilateral lacrimal glands: Secondary | ICD-10-CM | POA: Diagnosis not present

## 2016-01-10 ENCOUNTER — Other Ambulatory Visit: Payer: Self-pay | Admitting: *Deleted

## 2016-01-10 ENCOUNTER — Ambulatory Visit (INDEPENDENT_AMBULATORY_CARE_PROVIDER_SITE_OTHER): Payer: Medicare Other | Admitting: *Deleted

## 2016-01-10 DIAGNOSIS — E785 Hyperlipidemia, unspecified: Secondary | ICD-10-CM

## 2016-01-10 DIAGNOSIS — Z23 Encounter for immunization: Secondary | ICD-10-CM | POA: Diagnosis not present

## 2016-01-10 MED ORDER — LOSARTAN POTASSIUM 50 MG PO TABS: ORAL_TABLET | ORAL | 0 refills | Status: DC

## 2016-01-10 MED ORDER — ATORVASTATIN CALCIUM 20 MG PO TABS: 20.0000 mg | ORAL_TABLET | Freq: Every day | ORAL | 0 refills | Status: DC

## 2016-04-02 ENCOUNTER — Ambulatory Visit: Payer: Medicare Other | Admitting: Internal Medicine

## 2016-04-02 ENCOUNTER — Ambulatory Visit: Payer: Medicare Other | Admitting: Family

## 2016-04-02 NOTE — Progress Notes (Deleted)
Subjective:    Patient ID: Maria Bass, female    DOB: May 19, 1929, 82 y.o.   MRN: [id number]  CC: Maria Bass is a 82 y.o. female who presents today for follow up.   HPI:   HTN  Depression  CPE 09/2015.     HISTORY:  Past Medical History:  Diagnosis Date  . Depression   . Hyperlipidemia   . Hypertension   . IBS (irritable bowel syndrome)    per pt- not dx as IBS, but has IBS "symptoms"  . MAI (mycobacterium avium-intracellulare) ([place]) 2003   Past Surgical History:  Procedure Laterality Date  . ABDOMINAL HYSTERECTOMY    . BLADDER REPAIR    . CATARACT EXTRACTION, BILATERAL    . TONSILLECTOMY     Family History  Problem Relation Age of Onset  . Heart attack Mother   . Heart attack Father   . Colon cancer Neg Hx   . Esophageal cancer Neg Hx   . Stomach cancer Neg Hx     Allergies: Patient has no known allergies. Current Outpatient Prescriptions on File Prior to Visit  Medication Sig Dispense Refill  . Ascorbic Acid (VITAMIN C) 1000 MG tablet Take 1,000 mg by mouth daily.      [name] atorvastatin (LIPITOR) 20 MG tablet Take 1 tablet (20 mg total) by mouth daily. 90 tablet 0  . Biotin 1000 MCG tablet Take 5,000 mcg by mouth daily.     . cholecalciferol (VITAMIN D) 1000 UNITS tablet Take 1,000 Units by mouth daily.      . Fish Oil OIL by Does not apply route.    . hyoscyamine (LEVSIN SL) 0.125 MG SL tablet Place 1 tablet (0.125 mg total) under the tongue every 4 (four) hours as needed for cramping. 90 tablet 3  . loperamide (IMODIUM A-D) 2 MG tablet Take 1 tablet (2 mg total) by mouth 4 (four) times daily as needed for diarrhea or loose stools. 180 tablet 3  . losartan (COZAAR) 50 MG tablet TAKE 1 TABLET DAILY 90 tablet 0  . mirtazapine (REMERON) 45 MG tablet Take 1 tablet (45 mg total) by mouth at bedtime. 90 tablet 3  . Probiotic Product (PROBIOTIC DAILY PO) Take 1 tablet by mouth daily.     No current facility-administered medications on file prior to  visit.     Social History  Substance Use Topics  . Smoking status: Former Smoker    Types: Cigarettes    Quit date: 04/23/1978  . Smokeless tobacco: Never Used     Comment: social smoker x 15  . Alcohol use 0.0 Bass/week     Comment: glass a wine at night    Review of Systems    Objective:    There were no vitals taken for this visit. BP Readings from Last 3 Encounters:  09/30/15 116/74  08/30/15 110/68  02/07/15 102/66   Wt Readings from Last 3 Encounters:  09/30/15 121 lb (54.9 kg)  08/30/15 121 lb (54.9 kg)  02/07/15 124 lb 2 Bass (56.3 kg)    Physical Exam     Assessment & Plan:   Problem List Items Addressed This Visit    None       I am having Maria Bass maintain her Biotin, cholecalciferol, vitamin C, Probiotic Product (PROBIOTIC DAILY PO), hyoscyamine, loperamide, mirtazapine, Fish Oil, atorvastatin, and losartan.   No orders of the defined types were placed in this encounter.   Return precautions given.   Risks, benefits, and alternatives  of the medications and treatment plan prescribed today were discussed, and patient expressed understanding.   Education regarding symptom management and diagnosis given to patient on AVS.  Continue to follow with Maria Mast, Maria Bass for routine health maintenance.   Maria Bass and I agreed with plan.   Maria Paris, Maria Bass

## 2016-04-10 ENCOUNTER — Encounter: Payer: Self-pay | Admitting: Family

## 2016-04-10 ENCOUNTER — Ambulatory Visit (INDEPENDENT_AMBULATORY_CARE_PROVIDER_SITE_OTHER): Payer: Medicare Other | Admitting: Family

## 2016-04-10 VITALS — BP 106/60 | HR 84 | Temp 98.1°F | Ht 62.0 in | Wt 117.6 lb

## 2016-04-10 DIAGNOSIS — R413 Other amnesia: Secondary | ICD-10-CM | POA: Diagnosis not present

## 2016-04-10 NOTE — Progress Notes (Signed)
Subjective:    Patient ID: Maria Bass, female    DOB: Apr 17, 1930, 82 y.o.   MRN: [id number]  CC: Maria Bass is a 82 y.o. female who presents today for follow up.   HPI: Here for f/u   Concern for memory changes  Memory changes- 2016 at [facility] neurocognitive testing Dr Kenton Kingfisher, no dementia or concern for alzheimer (scanned into chart today). Trouble remembering names, driving well. Able to manage financials, grocery shopping, getting dressed.   H/o anxiety.        HISTORY:  Past Medical History:  Diagnosis Date  . Depression   . Hyperlipidemia   . Hypertension   . IBS (irritable bowel syndrome)    per pt- not dx as IBS, but has IBS "symptoms"  . MAI (mycobacterium avium-intracellulare) ([place]) 2003   Past Surgical History:  Procedure Laterality Date  . ABDOMINAL HYSTERECTOMY    . BLADDER REPAIR    . CATARACT EXTRACTION, BILATERAL    . TONSILLECTOMY     Family History  Problem Relation Age of Onset  . Heart attack Mother   . Heart attack Father   . Colon cancer Neg Hx   . Esophageal cancer Neg Hx   . Stomach cancer Neg Hx     Allergies: Patient has no known allergies. Current Outpatient Prescriptions on File Prior to Visit  Medication Sig Dispense Refill  . Ascorbic Acid (VITAMIN C) 1000 MG tablet Take 1,000 mg by mouth daily.      [name] atorvastatin (LIPITOR) 20 MG tablet Take 1 tablet (20 mg total) by mouth daily. 90 tablet 0  . Biotin 1000 MCG tablet Take 5,000 mcg by mouth daily.     . cholecalciferol (VITAMIN D) 1000 UNITS tablet Take 1,000 Units by mouth daily.      . Fish Oil OIL by Does not apply route.    . hyoscyamine (LEVSIN SL) 0.125 MG SL tablet Place 1 tablet (0.125 mg total) under the tongue every 4 (four) hours as needed for cramping. 90 tablet 3  . loperamide (IMODIUM A-D) 2 MG tablet Take 1 tablet (2 mg total) by mouth 4 (four) times daily as needed for diarrhea or loose stools. 180 tablet 3  . losartan (COZAAR) 50 MG tablet TAKE 1 TABLET  DAILY 90 tablet 0  . Probiotic Product (PROBIOTIC DAILY PO) Take 1 tablet by mouth daily.     No current facility-administered medications on file prior to visit.     Social History  Substance Use Topics  . Smoking status: Former Smoker    Types: Cigarettes    Quit date: 04/23/1978  . Smokeless tobacco: Never Used     Comment: social smoker x 15  . Alcohol use 0.0 oz/week     Comment: glass a wine at night    Review of Systems  Constitutional: Negative for chills and fever.  Respiratory: Negative for cough.   Cardiovascular: Negative for chest pain and palpitations.  Gastrointestinal: Negative for nausea and vomiting.  Psychiatric/Behavioral: Negative for confusion. The patient is nervous/anxious.       Objective:    BP 106/60   Pulse 84   Temp 98.1 F (36.7 C) (Oral)   Ht 5\' 2"  (1.575 m)   Wt 117 lb 9.6 oz (53.3 kg)   SpO2 94%   BMI 21.51 kg/m  BP Readings from Last 3 Encounters:  04/10/16 106/60  09/30/15 116/74  08/30/15 110/68   Wt Readings from Last 3 Encounters:  04/10/16 117 lb 9.6  oz (53.3 kg)  09/30/15 121 lb (54.9 kg)  08/30/15 121 lb (54.9 kg)    Physical Exam  Constitutional: She appears well-developed and well-nourished.  Eyes: Conjunctivae are normal.  Cardiovascular: Normal rate, regular rhythm, normal heart sounds and normal pulses.   Pulmonary/Chest: Effort normal and breath sounds normal. She has no wheezes. She has no rhonchi. She has no rales.  Neurological: She is alert.  Skin: Skin is warm and dry.  Psychiatric: She has a normal mood and affect. Her speech is normal and behavior is normal. Thought content normal. Her mood appears not anxious.  Appropriately dressed.   Vitals reviewed.      Assessment & Plan:   Problem List Items Addressed This Visit      Other   Memory loss - Primary    I am very reassured by patient's ability to perform ADLs. She well put together and delightful today.  Mini Mental status also very reassuring  28/30. Provided reassurance to patient. Discussed alternative causes of concern including anxiety, depression. Offered referral to neurology and MRI brain. Patient politely declined at this time. We jointly decided to follow symptoms for the next 3 months and patient keep a log of mood, instances of memory problems.          I have discontinued Ms. Fedorchak's mirtazapine. I am also having her maintain her Biotin, cholecalciferol, vitamin C, Probiotic Product (PROBIOTIC DAILY PO), hyoscyamine, loperamide, Fish Oil, atorvastatin, and losartan.   No orders of the defined types were placed in this encounter.   Return precautions given.   Risks, benefits, and alternatives of the medications and treatment plan prescribed today were discussed, and patient expressed understanding.   Education regarding symptom management and diagnosis given to patient on AVS.  Continue to follow with Rica Mast, MD for routine health maintenance.   Eyvonne Mechanic and I agreed with plan.   Mable Paris, [name]  Total of 25 minutes spent with patient, greater than 50% of which was spent in discussion of  Memory loss, anxiety, depression.

## 2016-04-10 NOTE — Progress Notes (Signed)
Pre visit review using our clinic review tool, if applicable. No additional management support is needed unless otherwise documented below in the visit note. 

## 2016-04-10 NOTE — Patient Instructions (Signed)
Lets continue to monitor  F/u 3 months

## 2016-04-10 NOTE — Assessment & Plan Note (Addendum)
I am very reassured by patient's ability to perform ADLs. She well put together and delightful today.  Mini Mental status also very reassuring 28/30. Provided reassurance to patient. Discussed alternative causes of concern including anxiety, depression. Offered referral to neurology and MRI brain. Patient politely declined at this time. We jointly decided to follow symptoms for the next 3 months and patient keep a log of mood, instances of memory problems.

## 2016-06-08 DIAGNOSIS — J189 Pneumonia, unspecified organism: Secondary | ICD-10-CM | POA: Diagnosis not present

## 2016-06-08 DIAGNOSIS — R05 Cough: Secondary | ICD-10-CM | POA: Diagnosis not present

## 2016-06-11 ENCOUNTER — Encounter: Payer: Self-pay | Admitting: Family

## 2016-06-11 ENCOUNTER — Ambulatory Visit (INDEPENDENT_AMBULATORY_CARE_PROVIDER_SITE_OTHER): Payer: Medicare Other | Admitting: Family

## 2016-06-11 VITALS — BP 122/62 | HR 78 | Temp 98.3°F | Ht 62.0 in | Wt 115.4 lb

## 2016-06-11 DIAGNOSIS — J189 Pneumonia, unspecified organism: Secondary | ICD-10-CM | POA: Diagnosis not present

## 2016-06-11 MED ORDER — CEFDINIR 300 MG PO CAPS: 300.0000 mg | ORAL_CAPSULE | Freq: Two times a day (BID) | ORAL | 0 refills | Status: DC

## 2016-06-11 NOTE — Patient Instructions (Signed)
Start antibiotic for suspected pneumonia  Follow up in 3-4 weeks for office visit and we will repeat chest xray to ensure has cleared.  Let me know if you are not better

## 2016-06-11 NOTE — Progress Notes (Signed)
Subjective:    Patient ID: Maria Maria Bass, female    DOB: January 03, 1930, 83 y.o.   MRN: [id number]  CC: Maria Maria Bass is a 83 y.o. female who presents today for an acute visit.    HPI: Dry cough x 4 weeksm, unchanged. Accompanied with daughter. Went to [facility] 4 days ago and diagnosed with PNA. Wheezing few days ago, resolved. Endorses thick nasal congestion.  No antibiotics were given. No fever, SOB.   H/o MAI- treated at [facility]    HISTORY:  Past Medical History:  Diagnosis Date  . Depression   . Hyperlipidemia   . Hypertension   . IBS (irritable bowel syndrome)    per pt- not dx as IBS, but has IBS "symptoms"  . MAI (mycobacterium avium-intracellulare) ([place]) 2003   Past Surgical History:  Procedure Laterality Date  . ABDOMINAL HYSTERECTOMY    . BLADDER REPAIR    . CATARACT EXTRACTION, BILATERAL    . TONSILLECTOMY     Family History  Problem Relation Age of Onset  . Heart attack Mother   . Heart attack Father   . Colon cancer Neg Hx   . Esophageal cancer Neg Hx   . Stomach cancer Neg Hx     Allergies: Patient has no known allergies. Current Outpatient Prescriptions on File Prior to Visit  Medication Sig Dispense Refill  . Ascorbic Acid (VITAMIN C) 1000 MG tablet Take 1,000 mg by mouth daily.      [name] atorvastatin (LIPITOR) 20 MG tablet Take 1 tablet (20 mg total) by mouth daily. 90 tablet 0  . Biotin 1000 MCG tablet Take 5,000 mcg by mouth daily.     . cholecalciferol (VITAMIN D) 1000 UNITS tablet Take 1,000 Units by mouth daily.      . Fish Oil OIL by Does not apply route.    . hyoscyamine (LEVSIN SL) 0.125 MG SL tablet Place 1 tablet (0.125 mg total) under the tongue every 4 (four) hours as needed for cramping. 90 tablet 3  . loperamide (IMODIUM A-D) 2 MG tablet Take 1 tablet (2 mg total) by mouth 4 (four) times daily as needed for diarrhea or loose stools. 180 tablet 3  . losartan (COZAAR) 50 MG tablet TAKE 1 TABLET DAILY 90 tablet 0  . Probiotic Product  (PROBIOTIC DAILY PO) Take 1 tablet by mouth daily.     No current facility-administered medications on file prior to visit.     Social History  Substance Use Topics  . Smoking status: Former Smoker    Types: Cigarettes    Quit date: 04/23/1978  . Smokeless tobacco: Never Used     Comment: social smoker x 15  . Alcohol use 0.0 Maria Bass/week     Comment: glass a wine at night    Review of Systems  Constitutional: Negative for chills and fever.  HENT: Positive for congestion. Negative for sinus pressure and sore throat.   Respiratory: Negative for cough, shortness of breath and wheezing.   Cardiovascular: Negative for chest pain and palpitations.  Gastrointestinal: Negative for nausea and vomiting.      Objective:    BP 122/62   Pulse 78   Temp 98.3 F (36.8 C) (Oral)   Ht 5\' 2"  (1.575 m)   Wt 115 lb 6.4 Maria Bass (52.3 kg)   SpO2 97%   BMI 21.11 kg/m    Physical Exam  Constitutional: She appears well-developed and well-nourished.  HENT:  Head: Normocephalic and atraumatic.  Right Ear: Hearing, tympanic membrane, external  ear and ear canal normal. No drainage, swelling or tenderness. No foreign bodies. Tympanic membrane is not erythematous and not bulging. No middle ear effusion. No decreased hearing is noted.  Left Ear: Hearing, tympanic membrane, external ear and ear canal normal. No drainage, swelling or tenderness. No foreign bodies. Tympanic membrane is not erythematous and not bulging.  No middle ear effusion. No decreased hearing is noted.  Nose: Nose normal. No rhinorrhea. Right sinus exhibits no maxillary sinus tenderness and no frontal sinus tenderness. Left sinus exhibits no maxillary sinus tenderness and no frontal sinus tenderness.  Mouth/Throat: Uvula is midline, oropharynx is clear and moist and mucous membranes are normal. No oropharyngeal exudate, posterior oropharyngeal edema, posterior oropharyngeal erythema or tonsillar abscesses.  Eyes: Conjunctivae are normal.    Cardiovascular: Normal rate, regular rhythm, normal heart sounds and normal pulses.   Pulmonary/Chest: Effort normal and breath sounds normal. She has no wheezes. She has no rhonchi. She has no rales.  Lymphadenopathy:       Head (right side): No submental, no submandibular, no tonsillar, no preauricular, no posterior auricular and no occipital adenopathy present.       Head (left side): No submental, no submandibular, no tonsillar, no preauricular, no posterior auricular and no occipital adenopathy present.    She has no cervical adenopathy.  Neurological: She is alert.  Skin: Skin is warm and dry.  Psychiatric: She has a normal mood and affect. Her speech is normal and behavior is normal. Thought content normal.  Vitals reviewed.      Assessment & Plan:    1. Pneumonia due to infectious organism, unspecified laterality, unspecified part of lung Unable to view images brought on CD from [facility]. Working diagnosis of PNA. Will empirically treat. No fever, acute respiratory distress. Declines cough  Medication or inhaler. Will return in 3-4 weeks for OV and repeat CXR.   - cefdinir (OMNICEF) 300 MG capsule; Take 1 capsule (300 mg total) by mouth 2 (two) times daily.  Dispense: 20 capsule; Refill: 0   I am having Ms. Maria Maria Bass maintain her Biotin, cholecalciferol, vitamin C, Probiotic Product (PROBIOTIC DAILY PO), hyoscyamine, loperamide, Fish Oil, atorvastatin, and losartan.   No orders of the defined types were placed in this encounter.   Return precautions given.   Risks, benefits, and alternatives of the medications and treatment plan prescribed today were discussed, and patient expressed understanding.   Education regarding symptom management and diagnosis given to patient on AVS.  Continue to follow with Mable Paris, [name] for routine health maintenance.   Eyvonne Mechanic and I agreed with plan.   Mable Paris, [name]

## 2016-06-11 NOTE — Progress Notes (Signed)
Pre visit review using our clinic review tool, if applicable. No additional management support is needed unless otherwise documented below in the visit note. 

## 2016-06-25 ENCOUNTER — Ambulatory Visit (INDEPENDENT_AMBULATORY_CARE_PROVIDER_SITE_OTHER): Payer: Medicare Other | Admitting: Family

## 2016-06-25 ENCOUNTER — Encounter: Payer: Self-pay | Admitting: Family

## 2016-06-25 ENCOUNTER — Other Ambulatory Visit: Payer: Self-pay | Admitting: Family

## 2016-06-25 ENCOUNTER — Ambulatory Visit (INDEPENDENT_AMBULATORY_CARE_PROVIDER_SITE_OTHER): Payer: Medicare Other

## 2016-06-25 VITALS — BP 110/64 | HR 74 | Temp 97.9°F | Resp 15 | Wt 115.6 lb

## 2016-06-25 DIAGNOSIS — R9389 Abnormal findings on diagnostic imaging of other specified body structures: Secondary | ICD-10-CM

## 2016-06-25 DIAGNOSIS — J189 Pneumonia, unspecified organism: Secondary | ICD-10-CM

## 2016-06-25 NOTE — Patient Instructions (Addendum)
Chest Xray  Pleasure seeing you

## 2016-06-25 NOTE — Progress Notes (Signed)
Subjective:    Patient ID: Maria Bass, female    DOB: 1929-08-15, 83 y.o.   MRN: [id number]  CC: Maria Bass is a 83 y.o. female who presents today for follow up.   HPI: PNA one month ago RML 06/08/16 and treated with cefdinir at urgent care.  Here for repeat CXR and follow up.   Feeling well. Cough resolved. No fever, SOB.  H/o bronchiectasis; MAI    HISTORY:  Past Medical History:  Diagnosis Date  . Depression   . Hyperlipidemia   . Hypertension   . IBS (irritable bowel syndrome)    per pt- not dx as IBS, but has IBS "symptoms"  . MAI (mycobacterium avium-intracellulare) ([place]) 2003   treated at [facility]   Past Surgical History:  Procedure Laterality Date  . ABDOMINAL HYSTERECTOMY    . BLADDER REPAIR    . CATARACT EXTRACTION, BILATERAL    . TONSILLECTOMY     Family History  Problem Relation Age of Onset  . Heart attack Mother   . Heart attack Father   . Colon cancer Neg Hx   . Esophageal cancer Neg Hx   . Stomach cancer Neg Hx     Allergies: Patient has no known allergies. Current Outpatient Prescriptions on File Prior to Visit  Medication Sig Dispense Refill  . Ascorbic Acid (VITAMIN C) 1000 MG tablet Take 1,000 mg by mouth daily.      [name] atorvastatin (LIPITOR) 20 MG tablet Take 1 tablet (20 mg total) by mouth daily. 90 tablet 0  . Biotin 1000 MCG tablet Take 5,000 mcg by mouth daily.     . cefdinir (OMNICEF) 300 MG capsule Take 1 capsule (300 mg total) by mouth 2 (two) times daily. 20 capsule 0  . cholecalciferol (VITAMIN D) 1000 UNITS tablet Take 1,000 Units by mouth daily.      . Fish Oil OIL by Does not apply route.    . hyoscyamine (LEVSIN SL) 0.125 MG SL tablet Place 1 tablet (0.125 mg total) under the tongue every 4 (four) hours as needed for cramping. 90 tablet 3  . loperamide (IMODIUM A-D) 2 MG tablet Take 1 tablet (2 mg total) by mouth 4 (four) times daily as needed for diarrhea or loose stools. 180 tablet 3  . losartan (COZAAR) 50 MG tablet  TAKE 1 TABLET DAILY 90 tablet 0  . Probiotic Product (PROBIOTIC DAILY PO) Take 1 tablet by mouth daily.     No current facility-administered medications on file prior to visit.     Social History  Substance Use Topics  . Smoking status: Former Smoker    Types: Cigarettes    Quit date: 04/23/1978  . Smokeless tobacco: Never Used     Comment: social smoker x 15  . Alcohol use 0.0 Bass/week     Comment: glass a wine at night    Review of Systems  Constitutional: Negative for chills and fever.  Respiratory: Negative for cough.   Cardiovascular: Negative for chest pain and palpitations.  Gastrointestinal: Negative for abdominal pain, nausea and vomiting.  Musculoskeletal: Negative for arthralgias and myalgias.  Skin: Negative for rash.  Neurological: Negative for headaches.  Hematological: Negative for adenopathy.      Objective:    BP 110/64 (BP Location: Right Arm, Patient Position: Sitting, Cuff Size: Normal)   Pulse 74   Temp 97.9 F (36.6 C) (Oral)   Resp 15   Wt 115 lb 9.6 Bass (52.4 kg)   SpO2 97%  BMI 21.14 kg/m  BP Readings from Last 3 Encounters:  06/25/16 110/64  06/11/16 122/62  04/10/16 106/60   Wt Readings from Last 3 Encounters:  06/25/16 115 lb 9.6 Bass (52.4 kg)  06/11/16 115 lb 6.4 Bass (52.3 kg)  04/10/16 117 lb 9.6 Bass (53.3 kg)    Physical Exam  Constitutional: She appears well-developed and well-nourished.  Eyes: Conjunctivae are normal.  Cardiovascular: Normal rate, regular rhythm, normal heart sounds and normal pulses.   Pulmonary/Chest: Effort normal and breath sounds normal. She has no wheezes. She has no rhonchi. She has no rales.  Neurological: She is alert.  Skin: Skin is warm and dry.  Psychiatric: She has a normal mood and affect. Her speech is normal and behavior is normal. Thought content normal.  Vitals reviewed.      Assessment & Plan:   1. Pneumonia due to infectious organism, unspecified laterality, unspecified part of  lung Clinically resolved. Pending chest x-ray to ensure complete resolution. - DG Chest 2 View   I am having Maria Bass maintain her Biotin, cholecalciferol, vitamin C, Probiotic Product (PROBIOTIC DAILY PO), hyoscyamine, loperamide, Fish Oil, atorvastatin, losartan, and cefdinir.   No orders of the defined types were placed in this encounter.   Return precautions given.   Risks, benefits, and alternatives of the medications and treatment plan prescribed today were discussed, and patient expressed understanding.   Education regarding symptom management and diagnosis given to patient on AVS.  Continue to follow with Mable Paris, [name] for routine health maintenance.   Eyvonne Mechanic and I agreed with plan.   Mable Paris, [name]

## 2016-06-25 NOTE — Progress Notes (Signed)
ordered

## 2016-06-25 NOTE — Progress Notes (Signed)
Pre visit review using our clinic review tool, if applicable. No additional management support is needed unless otherwise documented below in the visit note. 

## 2016-06-27 ENCOUNTER — Encounter: Payer: Self-pay | Admitting: Family

## 2016-06-27 DIAGNOSIS — D045 Carcinoma in situ of skin of trunk: Secondary | ICD-10-CM | POA: Diagnosis not present

## 2016-06-27 DIAGNOSIS — D044 Carcinoma in situ of skin of scalp and neck: Secondary | ICD-10-CM | POA: Diagnosis not present

## 2016-06-27 DIAGNOSIS — D485 Neoplasm of uncertain behavior of skin: Secondary | ICD-10-CM | POA: Diagnosis not present

## 2016-06-27 DIAGNOSIS — Z85828 Personal history of other malignant neoplasm of skin: Secondary | ICD-10-CM | POA: Diagnosis not present

## 2016-06-27 DIAGNOSIS — Z08 Encounter for follow-up examination after completed treatment for malignant neoplasm: Secondary | ICD-10-CM | POA: Diagnosis not present

## 2016-07-03 ENCOUNTER — Other Ambulatory Visit: Payer: Self-pay

## 2016-07-03 ENCOUNTER — Ambulatory Visit
Admission: RE | Admit: 2016-07-03 | Discharge: 2016-07-03 | Disposition: A | Discharge status: Home / Self Care | Payer: Medicare Other | Source: Ambulatory Visit | Attending: Family | Admitting: Family

## 2016-07-03 ENCOUNTER — Ambulatory Visit: Payer: Medicare Other | Admitting: Family

## 2016-07-03 DIAGNOSIS — R938 Abnormal findings on diagnostic imaging of other specified body structures: Secondary | ICD-10-CM | POA: Diagnosis present

## 2016-07-03 DIAGNOSIS — R9389 Abnormal findings on diagnostic imaging of other specified body structures: Secondary | ICD-10-CM

## 2016-07-03 DIAGNOSIS — J479 Bronchiectasis, uncomplicated: Secondary | ICD-10-CM | POA: Diagnosis not present

## 2016-07-03 DIAGNOSIS — D3501 Benign neoplasm of right adrenal gland: Secondary | ICD-10-CM | POA: Insufficient documentation

## 2016-07-03 DIAGNOSIS — R918 Other nonspecific abnormal finding of lung field: Secondary | ICD-10-CM | POA: Diagnosis not present

## 2016-07-03 MED ORDER — LOSARTAN POTASSIUM 50 MG PO TABS: ORAL_TABLET | ORAL | 0 refills | Status: DC

## 2016-07-03 NOTE — Telephone Encounter (Signed)
Medication has been refilled.

## 2016-07-05 ENCOUNTER — Telehealth: Payer: Self-pay | Admitting: Family

## 2016-07-05 ENCOUNTER — Other Ambulatory Visit: Payer: Self-pay | Admitting: Family

## 2016-07-05 DIAGNOSIS — E785 Hyperlipidemia, unspecified: Secondary | ICD-10-CM

## 2016-07-05 DIAGNOSIS — J479 Bronchiectasis, uncomplicated: Secondary | ICD-10-CM

## 2016-07-05 MED ORDER — ATORVASTATIN CALCIUM 40 MG PO TABS: 40.0000 mg | ORAL_TABLET | Freq: Every day | ORAL | 0 refills | Status: DC

## 2016-07-05 NOTE — Telephone Encounter (Signed)
Pt called and left a voicemail looking for CT results. Please advise, thank you!  Call pt @ [phone]

## 2016-07-06 NOTE — Telephone Encounter (Signed)
Patient advised of imaging results dated 07/03/16.   Appointment scheduled to review results on 07/09/16.

## 2016-07-09 ENCOUNTER — Ambulatory Visit: Payer: Medicare Other | Admitting: Family

## 2016-07-09 ENCOUNTER — Ambulatory Visit (INDEPENDENT_AMBULATORY_CARE_PROVIDER_SITE_OTHER): Payer: Medicare Other | Admitting: Family

## 2016-07-09 ENCOUNTER — Encounter: Payer: Self-pay | Admitting: Family

## 2016-07-09 DIAGNOSIS — R918 Other nonspecific abnormal finding of lung field: Secondary | ICD-10-CM | POA: Diagnosis not present

## 2016-07-09 DIAGNOSIS — E785 Hyperlipidemia, unspecified: Secondary | ICD-10-CM | POA: Diagnosis not present

## 2016-07-09 NOTE — Assessment & Plan Note (Signed)
Reassured provided to patient as suspect all changes are chronic. Multiple nodules seen on CT chest likely indicate chronic MAC. Patient feels well and is asymptomatic. We jointly agreed that second opinion with pulmonology appropriate next step, especially in context of bronchiectasis.

## 2016-07-09 NOTE — Progress Notes (Signed)
Pre visit review using our clinic review tool, if applicable. No additional management support is needed unless otherwise documented below in the visit note. 

## 2016-07-09 NOTE — Patient Instructions (Addendum)
Pleasure seeing you.  Referral to pulmonology

## 2016-07-09 NOTE — Progress Notes (Signed)
Subjective:    Patient ID: Maria Bass, female    DOB: 1929-04-28, 83 y.o.   MRN: [id number]  CC: Maria Bass is a 83 y.o. female who presents today for follow up.   HPI: Concerned by results of CT chest. Accompanied with daughter.   No cough, fever, SOB, congestion. Feels well today.   HLD- hasn't been taking lipitor. No CP, SOB. Stress test 2016 normal.     HISTORY:  Past Medical History:  Diagnosis Date  . Depression   . Hyperlipidemia   . Hypertension   . IBS (irritable bowel syndrome)    per pt- not dx as IBS, but has IBS "symptoms"  . MAI (mycobacterium avium-intracellulare) ([place]) 2003   treated at [facility]   Past Surgical History:  Procedure Laterality Date  . ABDOMINAL HYSTERECTOMY    . BLADDER REPAIR    . CATARACT EXTRACTION, BILATERAL    . TONSILLECTOMY     Family History  Problem Relation Age of Onset  . Heart attack Mother   . Heart attack Father   . Colon cancer Neg Hx   . Esophageal cancer Neg Hx   . Stomach cancer Neg Hx     Allergies: Patient has no known allergies. Current Outpatient Prescriptions on File Prior to Visit  Medication Sig Dispense Refill  . Ascorbic Acid (VITAMIN C) 1000 MG tablet Take 1,000 mg by mouth daily.      [name] atorvastatin (LIPITOR) 40 MG tablet Take 1 tablet (40 mg total) by mouth daily. 90 tablet 0  . Biotin 1000 MCG tablet Take 5,000 mcg by mouth daily.     . cefdinir (OMNICEF) 300 MG capsule Take 1 capsule (300 mg total) by mouth 2 (two) times daily. 20 capsule 0  . cholecalciferol (VITAMIN D) 1000 UNITS tablet Take 1,000 Units by mouth daily.      . Fish Oil OIL by Does not apply route.    . hyoscyamine (LEVSIN SL) 0.125 MG SL tablet Place 1 tablet (0.125 mg total) under the tongue every 4 (four) hours as needed for cramping. 90 tablet 3  . loperamide (IMODIUM A-D) 2 MG tablet Take 1 tablet (2 mg total) by mouth 4 (four) times daily as needed for diarrhea or loose stools. 180 tablet 3  . losartan (COZAAR) 50 MG  tablet TAKE 1 TABLET DAILY 90 tablet 0  . Probiotic Product (PROBIOTIC DAILY PO) Take 1 tablet by mouth daily.     No current facility-administered medications on file prior to visit.     Social History  Substance Use Topics  . Smoking status: Former Smoker    Types: Cigarettes    Quit date: 04/23/1978  . Smokeless tobacco: Never Used     Comment: social smoker x 15  . Alcohol use 0.0 oz/week     Comment: glass a wine at night    Review of Systems  Constitutional: Negative for chills and fever.  HENT: Negative for congestion.   Respiratory: Negative for cough, shortness of breath and wheezing.   Cardiovascular: Negative for chest pain and palpitations.  Gastrointestinal: Negative for nausea and vomiting.      Objective:    BP 110/78   Pulse 67   Temp 97.7 F (36.5 C) (Oral)   Ht _0  (1.575 m)   Wt 114 lb 9.6 oz (52 kg)   SpO2 97%   BMI 20.96 kg/m  BP Readings from Last 3 Encounters:  07/09/16 110/78  06/25/16 110/64  06/11/16 122/62  Wt Readings from Last 3 Encounters:  07/09/16 114 lb 9.6 oz (52 kg)  06/25/16 115 lb 9.6 oz (52.4 kg)  06/11/16 115 lb 6.4 oz (52.3 kg)    Physical Exam  Constitutional: She appears well-developed and well-nourished.  Eyes: Conjunctivae are normal.  Cardiovascular: Normal rate, regular rhythm, normal heart sounds and normal pulses.   Pulmonary/Chest: Effort normal and breath sounds normal. She has no wheezes. She has no rhonchi. She has no rales.  Neurological: She is alert.  Skin: Skin is warm and dry.  Psychiatric: She has a normal mood and affect. Her speech is normal and behavior is normal. Thought content normal.  Vitals reviewed.      Assessment & Plan:   Problem List Items Addressed This Visit      Other   Hyperlipidemia    Seen on Ct chest. Increased lipitor.       Abnormal chest x-ray with multiple lung nodules    Reassured provided to patient as suspect all changes are chronic. Multiple nodules seen on CT  chest likely indicate chronic MAC. Patient feels well and is asymptomatic. We jointly agreed that second opinion with pulmonology appropriate next step, especially in context of bronchiectasis.           I am having [name] maintain her Biotin, cholecalciferol, vitamin C, Probiotic Product (PROBIOTIC DAILY PO), hyoscyamine, loperamide, Fish Oil, cefdinir, losartan, and atorvastatin.   No orders of the defined types were placed in this encounter.   Return precautions given.   Risks, benefits, and alternatives of the medications and treatment plan prescribed today were discussed, and patient expressed understanding.   Education regarding symptom management and diagnosis given to patient on AVS.  Continue to follow with Mable Paris, [name] for routine health maintenance.   Eyvonne Mechanic and I agreed with plan.   Mable Paris, [name]

## 2016-07-09 NOTE — Assessment & Plan Note (Signed)
Seen on Ct chest. Increased lipitor.

## 2016-07-25 ENCOUNTER — Ambulatory Visit: Payer: Medicare Other | Admitting: Family

## 2016-07-26 DIAGNOSIS — L908 Other atrophic disorders of skin: Secondary | ICD-10-CM | POA: Diagnosis not present

## 2016-07-26 DIAGNOSIS — D044 Carcinoma in situ of skin of scalp and neck: Secondary | ICD-10-CM | POA: Diagnosis not present

## 2016-07-26 DIAGNOSIS — L578 Other skin changes due to chronic exposure to nonionizing radiation: Secondary | ICD-10-CM | POA: Diagnosis not present

## 2016-07-26 DIAGNOSIS — L814 Other melanin hyperpigmentation: Secondary | ICD-10-CM | POA: Diagnosis not present

## 2016-08-09 DIAGNOSIS — G9009 Other idiopathic peripheral autonomic neuropathy: Secondary | ICD-10-CM | POA: Diagnosis not present

## 2016-08-13 ENCOUNTER — Telehealth: Payer: Self-pay | Admitting: Radiology

## 2016-08-13 ENCOUNTER — Ambulatory Visit (INDEPENDENT_AMBULATORY_CARE_PROVIDER_SITE_OTHER): Payer: Medicare Other | Admitting: Family

## 2016-08-13 ENCOUNTER — Encounter: Payer: Self-pay | Admitting: Family

## 2016-08-13 VITALS — BP 104/68 | HR 75 | Temp 98.1°F | Ht 62.0 in | Wt 114.6 lb

## 2016-08-13 DIAGNOSIS — R413 Other amnesia: Secondary | ICD-10-CM | POA: Diagnosis not present

## 2016-08-13 LAB — POCT URINALYSIS DIPSTICK
Bilirubin, UA: NEGATIVE
Blood, UA: NEGATIVE
Glucose, UA: NEGATIVE
KETONES UA: NEGATIVE
Nitrite, UA: NEGATIVE
PH UA: 5.5 (ref 5.0–8.0)
SPEC GRAV UA: 1.02 (ref 1.010–1.025)
Urobilinogen, UA: 0.2 E.U./dL

## 2016-08-13 LAB — COMPREHENSIVE METABOLIC PANEL
ALT: 14 U/L (ref 0–35)
AST: 16 U/L (ref 0–37)
Albumin: 4.3 g/dL (ref 3.5–5.2)
Alkaline Phosphatase: 132 U/L — ABNORMAL HIGH (ref 39–117)
BUN: 17 mg/dL (ref 6–23)
CALCIUM: 9.6 mg/dL (ref 8.4–10.5)
CHLORIDE: 107 meq/L (ref 96–112)
CO2: 32 meq/L (ref 19–32)
CREATININE: 0.78 mg/dL (ref 0.40–1.20)
GFR: 74.67 mL/min (ref >60.00)
Glucose, Bld: 112 mg/dL — ABNORMAL HIGH (ref 70–99)
POTASSIUM: 4 meq/L (ref 3.5–5.1)
SODIUM: 144 meq/L (ref 135–145)
Total Bilirubin: 0.7 mg/dL (ref 0.2–1.2)
Total Protein: 6.8 g/dL (ref 6.0–8.3)

## 2016-08-13 LAB — B12 AND FOLATE PANEL
FOLATE: 10.6 ng/mL (ref >5.9)
Vitamin B-12: 348 pg/mL (ref 211–911)

## 2016-08-13 LAB — CBC WITH DIFFERENTIAL/PLATELET
BASOS ABS: 0 10*3/uL (ref 0.0–0.1)
Basophils Relative: 0.4 % (ref 0.0–3.0)
EOS ABS: 0.1 10*3/uL (ref 0.0–0.7)
Eosinophils Relative: 1.5 % (ref 0.0–5.0)
HEMATOCRIT: 37.4 % (ref 36.0–46.0)
HEMOGLOBIN: 12.5 g/dL (ref 12.0–15.0)
LYMPHS PCT: 13.5 % (ref 12.0–46.0)
Lymphs Abs: 1.2 10*3/uL (ref 0.7–4.0)
MCHC: 33.5 g/dL (ref 30.0–36.0)
MCV: 89.1 fl (ref 78.0–100.0)
Monocytes Absolute: 0.6 10*3/uL (ref 0.1–1.0)
Monocytes Relative: 7.3 % (ref 3.0–12.0)
Neutro Abs: 6.8 10*3/uL (ref 1.4–7.7)
Neutrophils Relative %: 77.3 % — ABNORMAL HIGH (ref 43.0–77.0)
PLATELETS: 213 10*3/uL (ref 150.0–400.0)
RBC: 4.2 Mil/uL (ref 3.87–5.11)
RDW: 13.9 % (ref 11.5–15.5)
WBC: 8.8 10*3/uL (ref 4.0–10.5)

## 2016-08-13 LAB — T3, FREE: T3 FREE: 2.8 pg/mL (ref 2.3–4.2)

## 2016-08-13 LAB — TSH: TSH: 0.84 u[IU]/mL (ref 0.35–4.50)

## 2016-08-13 NOTE — Progress Notes (Signed)
Pre visit review using our clinic review tool, if applicable. No additional management support is needed unless otherwise documented below in the visit note. 

## 2016-08-13 NOTE — Assessment & Plan Note (Addendum)
Worsening. 29/30 on mini mental status exam. Due to worsening over past 6 months, patient, daughter, and I that a thorough work up appropriate next step. Pending  Labs, MRI. Will send to either neuropsy or neurology based on results.

## 2016-08-13 NOTE — Addendum Note (Signed)
Addended by: Leeanne Rio on: 08/13/2016 04:32 PM   Modules accepted: Orders

## 2016-08-13 NOTE — Patient Instructions (Addendum)
Labs today  MRI  Will decide from their whether we go to neurology or neuropsychology

## 2016-08-13 NOTE — Addendum Note (Signed)
Addended by: Burnard Hawthorne on: 08/13/2016 04:09 PM   Modules accepted: Orders

## 2016-08-13 NOTE — Progress Notes (Signed)
Subjective:    Patient ID: Maria Bass, female    DOB: Feb 06, 1930, 83 y.o.   MRN: [id number]  CC: Maria Bass is a 83 y.o. female who presents today for follow up.   HPI: Concerns for memory loss. Confusing dates. Repeating herself.   Daughter here today and notes mother has been confusing dates, AM/PM. This has been off and on for 6 years, worsening last 6 months. People at [facility] have also shared concern for memory.   We received from call dermatology regarding concern for mental status changes.     Saw Maria Bass [address]    HISTORY:  Past Medical History:  Diagnosis Date  . Depression   . Hyperlipidemia   . Hypertension   . IBS (irritable bowel syndrome)    per pt- not dx as IBS, but has IBS "symptoms"  . MAI (mycobacterium avium-intracellulare) ([place]) 2003   treated at [facility]   Past Surgical History:  Procedure Laterality Date  . ABDOMINAL HYSTERECTOMY    . BLADDER REPAIR    . CATARACT EXTRACTION, BILATERAL    . TONSILLECTOMY     Family History  Problem Relation Age of Onset  . Heart attack Mother   . Heart attack Father   . Colon cancer Neg Hx   . Esophageal cancer Neg Hx   . Stomach cancer Neg Hx     Allergies: Patient has no known allergies. Current Outpatient Prescriptions on File Prior to Visit  Medication Sig Dispense Refill  . Ascorbic Acid (VITAMIN C) 1000 MG tablet Take 1,000 mg by mouth daily.      [name] atorvastatin (LIPITOR) 40 MG tablet Take 1 tablet (40 mg total) by mouth daily. 90 tablet 0  . Biotin 1000 MCG tablet Take 5,000 mcg by mouth daily.     . cefdinir (OMNICEF) 300 MG capsule Take 1 capsule (300 mg total) by mouth 2 (two) times daily. 20 capsule 0  . cholecalciferol (VITAMIN D) 1000 UNITS tablet Take 1,000 Units by mouth daily.      . Fish Oil OIL by Does not apply route.    . hyoscyamine (LEVSIN SL) 0.125 MG SL tablet Place 1 tablet (0.125 mg total) under the tongue every 4 (four) hours as needed for cramping.  90 tablet 3  . loperamide (IMODIUM A-D) 2 MG tablet Take 1 tablet (2 mg total) by mouth 4 (four) times daily as needed for diarrhea or loose stools. 180 tablet 3  . losartan (COZAAR) 50 MG tablet TAKE 1 TABLET DAILY 90 tablet 0  . Probiotic Product (PROBIOTIC DAILY PO) Take 1 tablet by mouth daily.     No current facility-administered medications on file prior to visit.     Social History  Substance Use Topics  . Smoking status: Former Smoker    Types: Cigarettes    Quit date: 04/23/1978  . Smokeless tobacco: Never Used     Comment: social smoker x 15  . Alcohol use 0.0 Bass/week     Comment: glass a wine at night    Review of Systems  Constitutional: Negative for chills and fever.  Respiratory: Negative for cough.   Cardiovascular: Negative for chest pain and palpitations.  Gastrointestinal: Negative for nausea and vomiting.  Neurological: Negative for headaches.  Psychiatric/Behavioral: Positive for confusion.      Objective:    BP 104/68   Pulse 75   Temp 98.1 F (36.7 C) (Oral)   Ht 5\' 2"  (1.575 m)   Wt 114  lb 9.6 Bass (52 kg)   SpO2 95%   BMI 20.96 kg/m  BP Readings from Last 3 Encounters:  08/13/16 104/68  07/09/16 110/78  06/25/16 110/64   Wt Readings from Last 3 Encounters:  08/13/16 114 lb 9.6 Bass (52 kg)  07/09/16 114 lb 9.6 Bass (52 kg)  06/25/16 115 lb 9.6 Bass (52.4 kg)    Physical Exam  Constitutional: She appears well-developed and well-nourished.  Eyes: Conjunctivae are normal.  Cardiovascular: Normal rate, regular rhythm, normal heart sounds and normal pulses.   Pulmonary/Chest: Effort normal and breath sounds normal. She has no wheezes. She has no rhonchi. She has no rales.  Neurological: She is alert.  Skin: Skin is warm and dry.  Psychiatric: She has a normal mood and affect. Her speech is normal and behavior is normal. Thought content normal.  Vitals reviewed.      Assessment & Plan:   Problem List Items Addressed This Visit      Other    Memory loss - Primary    Worsening. 29/30 on mini mental status exam. Due to worsening over past 6 months, patient, daughter, and I that a thorough work up appropriate next step. Pending  Labs, MRI. Will send to either neuropsy or neurology based on results.          Relevant Orders   B12 and Folate Panel   RPR   T3, free   TSH   CBC with Differential/Platelet   Comprehensive metabolic panel   MR BRAIN W WO CONTRAST   POCT urinalysis dipstick   Urine Culture   Urine Microscopic Only       I am having [name] maintain her Biotin, cholecalciferol, vitamin C, Probiotic Product (PROBIOTIC DAILY PO), hyoscyamine, loperamide, Fish Oil, cefdinir, losartan, and atorvastatin.   No orders of the defined types were placed in this encounter.   Return precautions given.   Risks, benefits, and alternatives of the medications and treatment plan prescribed today were discussed, and patient expressed understanding.   Education regarding symptom management and diagnosis given to patient on AVS.  Continue to follow with Maria Paris, [name] for routine health maintenance.   Eyvonne Mechanic and I agreed with plan.   Maria Paris, [name]

## 2016-08-13 NOTE — Telephone Encounter (Signed)
noted 

## 2016-08-13 NOTE — Telephone Encounter (Signed)
Pt could not obtain urine specimen today. Pt states she will bring urine specimen back.

## 2016-08-14 ENCOUNTER — Ambulatory Visit (INDEPENDENT_AMBULATORY_CARE_PROVIDER_SITE_OTHER): Payer: Medicare Other | Admitting: Internal Medicine

## 2016-08-14 ENCOUNTER — Encounter: Payer: Self-pay | Admitting: Internal Medicine

## 2016-08-14 VITALS — BP 110/68 | HR 66 | Resp 16 | Ht 62.0 in | Wt 114.6 lb

## 2016-08-14 DIAGNOSIS — J479 Bronchiectasis, uncomplicated: Secondary | ICD-10-CM

## 2016-08-14 LAB — RPR

## 2016-08-14 NOTE — Progress Notes (Signed)
[facility] Consultation     Date: 08/14/2016,   MRN# [id number] Maria Bass [birth year] Code Status:  Code Status History    This patient does not have a recorded code status. Please follow your organizational policy for patients in this situation.     Hosp day:@LENGTHOFSTAYDAYS @ Referring MD: @ATDPROV @     PCP:      AdmissionWeight: 114 lb 9.6 oz (52 kg)                 CurrentWeight: 114 lb 9.6 oz (52 kg) Maria Bass is a 83 y.o. old female seen in consultation for abnormal CT chest at the request of Dr. Nena Polio.  Previous [facility] patient h/o bronchiectasis h/o MAI   CHIEF COMPLAINT:   Follow up nodules   HISTORY OF PRESENT ILLNESS  83 y/o female followed by Drs. Dorie Rank, and Brock Bad previously for bronchiectasis believed to be due to MAI. -She underwent antibiotic treatment in both 2003-2005 and 2006.  -She states that she doesn't think the antibiotics helped much.  She has occassional flares requiting antibiotics and steroids.     Currently,  She continues to exercise daily and feels quite well today. She has no acute issues or complaints She had chest congestion and productive cough 2 months ago and was given abx  She has no acute sign of infection at this time No resp issues at this time     MEDICATIONS    Home Medication:  Current Outpatient Rx  . Order #: [id number] Class: Historical Med  . Order #: [id number] Class: Normal  . Order #: [id number] Class: Historical Med  . Order #: [id number] Class: Historical Med  . Order #: [id number] Class: Historical Med  . Order #: [id number] Class: Normal  . Order #: [id number] Class: Normal  . Order #: [id number] Class: Normal  . Order #: [id number] Class: Historical Med    Current Medication:   Current Outpatient Prescriptions:  .  Ascorbic Acid (VITAMIN C) 1000 MG tablet, Take 1,000 mg by mouth daily.  , Disp: , Rfl:  .  atorvastatin (LIPITOR) 40 MG tablet, Take 1 tablet (40 mg  total) by mouth daily., Disp: 90 tablet, Rfl: 0 .  Biotin 1000 MCG tablet, Take 5,000 mcg by mouth daily. , Disp: , Rfl:  .  cholecalciferol (VITAMIN D) 1000 UNITS tablet, Take 1,000 Units by mouth daily.  , Disp: , Rfl:  .  Fish Oil OIL, by Does not apply route., Disp: , Rfl:  .  hyoscyamine (LEVSIN SL) 0.125 MG SL tablet, Place 1 tablet (0.125 mg total) under the tongue every 4 (four) hours as needed for cramping., Disp: 90 tablet, Rfl: 3 .  loperamide (IMODIUM A-D) 2 MG tablet, Take 1 tablet (2 mg total) by mouth 4 (four) times daily as needed for diarrhea or loose stools., Disp: 180 tablet, Rfl: 3 .  losartan (COZAAR) 50 MG tablet, TAKE 1 TABLET DAILY, Disp: 90 tablet, Rfl: 0 .  Probiotic Product (PROBIOTIC DAILY PO), Take 1 tablet by mouth daily., Disp: , Rfl:        REVIEW OF SYSTEMS   Review of Systems  Constitutional: Negative for chills, diaphoresis, fever, malaise/fatigue and weight loss.  HENT: Negative for congestion and hearing loss.   Eyes: Negative for blurred vision and double vision.  Respiratory: Negative for cough, hemoptysis, sputum production, shortness of breath and wheezing.   Cardiovascular: Negative for chest pain, palpitations and orthopnea.  Gastrointestinal: Negative for abdominal pain, heartburn, nausea and vomiting.  Skin: Negative for rash.  Neurological: Negative for dizziness and weakness.  Endo/Heme/Allergies: Does not bruise/bleed easily.  Psychiatric/Behavioral: The patient is not nervous/anxious.   All other systems reviewed and are negative.    VS: BP 110/68 (BP Location: Left Arm, Patient Position: Sitting, Cuff Size: Normal)   Pulse 66   Resp 16   Ht 5\' 2"  (1.575 m)   Wt 114 lb 9.6 oz (52 kg)   SpO2 91%   BMI 20.96 kg/m      PHYSICAL EXAM   Physical Exam  Constitutional: She is oriented to person, place, and time. She appears well-developed and well-nourished. No distress.  HENT:  Head: Normocephalic and atraumatic.  Eyes: EOM  are normal. Pupils are equal, round, and reactive to light. No scleral icterus.  Neck: Neck supple.  Cardiovascular: Normal rate, regular rhythm and normal heart sounds.   No murmur heard. Pulmonary/Chest: Effort normal and breath sounds normal. No stridor. No respiratory distress. She has no wheezes. She has no rales.  Musculoskeletal: She exhibits no edema.  Neurological: She is alert and oriented to person, place, and time. No cranial nerve deficit.  Skin: She is not diaphoretic.        LABS    Recent Labs     08/13/16  1419  HGB  12.5  HCT  37.4  MCV  89.1  WBC  8.8  BUN  17  CREATININE  0.78  GLUCOSE  112*  CALCIUM  9.6    IMAGING   CT chest 06/2016 tree-in-bud opacities as well as diffuse bronchiectatic change and scarring   ASSESSMENT/PLAN   83 yo pleasant white female with chronic Bronchiectasis without any acute resp issues at this time She previous had symptoms of pneumonia and was treated with abx  She has no acute rep or chronic issues at this time Recommend Repeat CT chest in 3 months  Patient/Family are satisfied with Plan of action and management. All questions answered   Corrin Parker, M.D.  [facility]  Medical Director [place] Director [facility] Cardio-Pulmonary Department

## 2016-08-14 NOTE — Patient Instructions (Signed)
Follow up in 3 months with CT chest.

## 2016-08-15 ENCOUNTER — Telehealth: Payer: Self-pay | Admitting: Family

## 2016-08-15 ENCOUNTER — Ambulatory Visit
Admission: RE | Admit: 2016-08-15 | Discharge: 2016-08-15 | Disposition: A | Discharge status: Home / Self Care | Payer: Medicare Other | Source: Ambulatory Visit | Attending: Family | Admitting: Family

## 2016-08-15 DIAGNOSIS — R413 Other amnesia: Secondary | ICD-10-CM

## 2016-08-15 DIAGNOSIS — I639 Cerebral infarction, unspecified: Secondary | ICD-10-CM | POA: Diagnosis not present

## 2016-08-15 LAB — CULTURE, URINE COMPREHENSIVE

## 2016-08-15 MED ORDER — GADOBENATE DIMEGLUMINE 529 MG/ML IV SOLN
10.0000 mL | Freq: Once | INTRAVENOUS | Status: AC | PRN
  Administered 2016-08-15: 10 mL via INTRAVENOUS

## 2016-08-15 NOTE — Telephone Encounter (Signed)
Pt's daughter Joycelyn Schmid called and stated that she would like a call with what the pt is supposed to do after MRI. Pt does not remember. Please advise, thank you!  Call pt @ [phone]

## 2016-08-15 NOTE — Telephone Encounter (Signed)
Left voice mail to call back for Mardene Celeste daughter per [facility]

## 2016-08-15 NOTE — Telephone Encounter (Signed)
Spoke to patient  No new changes since saw her 2 days ago.   No further memory changes, vision changes, HA.   Start asa 81 mg today  Patient agreeable to seeing neurologist and doesn't feel the need to go to ER.   Discussed sign and symptoms of stroke and patient verbalized understanding of calling 911 if any symptoms  Left message with paula at dr shah's office to also discuss MRI

## 2016-08-15 NOTE — Telephone Encounter (Signed)
Discussed below per DPR with Mardene Celeste daughter she verbalized understanding.

## 2016-08-16 ENCOUNTER — Telehealth: Payer: Self-pay | Admitting: Family

## 2016-08-16 NOTE — Telephone Encounter (Signed)
Spoke with daughter at length about mri and if mother having any new symptoms. No acute memory changes, HA, vision changes that she knows off. We jointly agreed able to wait until appt with Jaynee Eagles unless any new symptoms and daughter understands to call 911.  Daughter will also speak with staff at [facility] regarding surveillance of her mother AND taking medications daily since she lives alone  Extensive education provided on signs, symptoms of stroke.

## 2016-08-17 ENCOUNTER — Telehealth: Payer: Self-pay | Admitting: Family

## 2016-08-17 NOTE — Telephone Encounter (Signed)
Pt daughter called back returning your call. Please advise, thank you!  Call pt @ [phone]

## 2016-08-17 NOTE — Telephone Encounter (Signed)
Please see patient results.

## 2016-08-29 ENCOUNTER — Ambulatory Visit: Payer: Medicare Other

## 2016-08-30 ENCOUNTER — Ambulatory Visit: Payer: Medicare Other

## 2016-08-30 ENCOUNTER — Ambulatory Visit (INDEPENDENT_AMBULATORY_CARE_PROVIDER_SITE_OTHER): Payer: Medicare Other | Admitting: Neurology

## 2016-08-30 ENCOUNTER — Encounter: Payer: Self-pay | Admitting: Neurology

## 2016-08-30 VITALS — BP 129/67 | HR 64 | Ht 62.0 in | Wt 114.4 lb

## 2016-08-30 DIAGNOSIS — F0390 Unspecified dementia without behavioral disturbance: Secondary | ICD-10-CM | POA: Diagnosis not present

## 2016-08-30 MED ORDER — DONEPEZIL HCL 10 MG PO TABS: ORAL_TABLET | ORAL | 11 refills | Status: DC

## 2016-08-30 NOTE — Patient Instructions (Signed)
Remember to drink plenty of fluid, eat healthy meals and do not skip any meals. Try to eat protein with a every meal and eat a healthy snack such as fruit or nuts in between meals. Try to keep a regular sleep-wake schedule and try to exercise daily, particularly in the form of walking, 20-30 minutes a day, if you can.   As far as your medications are concerned, I would like to suggest: Aricept start with 1/2 pill daily for one week and then increase to a whole pill  As far as diagnostic testing: Driving evaluation  I would like to see you back in 3 months, sooner if we need to. Please call us with any interim questions, concerns, problems, updates or refill requests.   Our phone number is [phone]. We also have an after hours call service for urgent matters and there is a physician on-call for urgent questions. For any emergencies you know to call 911 or go to the nearest emergency room  Donepezil tablets What is this medicine? DONEPEZIL (doe NEP e zil) is used to treat mild to moderate dementia caused by Alzheimer's disease. This medicine may be used for other purposes; ask your health care provider or pharmacist if you have questions. COMMON BRAND NAME(S): Aricept What should I tell my health care provider before I take this medicine? They need to know if you have any of these conditions: -asthma or other lung disease -difficulty passing urine -head injury -heart disease -history of irregular heartbeat -liver disease -seizures (convulsions) -stomach or intestinal disease, ulcers or stomach bleeding -an unusual or allergic reaction to donepezil, other medicines, foods, dyes, or preservatives -pregnant or trying to get pregnant -breast-feeding How should I use this medicine? Take this medicine by mouth with a glass of water. Follow the directions on the prescription label. You may take this medicine with or without food. Take this medicine at regular intervals. This medicine is  usually taken before bedtime. Do not take it more often than directed. Continue to take your medicine even if you feel better. Do not stop taking except on your doctor's advice. If you are taking the 23 mg donepezil tablet, swallow it whole; do not cut, crush, or chew it. Talk to your pediatrician regarding the use of this medicine in children. Special care may be needed. Overdosage: If you think you have taken too much of this medicine contact a poison control center or emergency room at once. NOTE: This medicine is only for you. Do not share this medicine with others. What if I miss a dose? If you miss a dose, take it as soon as you can. If it is almost time for your next dose, take only that dose, do not take double or extra doses. What may interact with this medicine? Do not take this medicine with any of the following medications: -certain medicines for fungal infections like itraconazole, fluconazole, posaconazole, and voriconazole -cisapride -dextromethorphan; quinidine -dofetilide -dronedarone -pimozide -quinidine -thioridazine -ziprasidone This medicine may also interact with the following medications: -antihistamines for allergy, cough and cold -atropine -bethanechol -carbamazepine -certain medicines for bladder problems like oxybutynin, tolterodine -certain medicines for Parkinson's disease like benztropine, trihexyphenidyl -certain medicines for stomach problems like dicyclomine, hyoscyamine -certain medicines for travel sickness like scopolamine -dexamethasone -ipratropium -NSAIDs, medicines for pain and inflammation, like ibuprofen or naproxen -other medicines for Alzheimer's disease -other medicines that prolong the QT interval (cause an abnormal heart rhythm) -phenobarbital -phenytoin -rifampin, rifabutin or rifapentine This list may not describe all possible  interactions. Give your health care provider a list of all the medicines, herbs, non-prescription drugs, or  dietary supplements you use. Also tell them if you smoke, drink alcohol, or use illegal drugs. Some items may interact with your medicine. What should I watch for while using this medicine? Visit your doctor or health care professional for regular checks on your progress. Check with your doctor or health care professional if your symptoms do not get better or if they get worse. You may get drowsy or dizzy. Do not drive, use machinery, or do anything that needs mental alertness until you know how this drug affects you. What side effects may I notice from receiving this medicine? Side effects that you should report to your doctor or health care professional as soon as possible: -allergic reactions like skin rash, itching or hives, swelling of the face, lips, or tongue -feeling faint or lightheaded, falls -loss of bladder control -seizures -signs and symptoms of a dangerous change in heartbeat or heart rhythm like chest pain; dizziness; fast or irregular heartbeat; palpitations; feeling faint or lightheaded, falls; breathing problems -signs and symptoms of infection like fever or chills; cough; sore throat; pain or trouble passing urine -signs and symptoms of liver injury like dark yellow or brown urine; general ill feeling or flu-like symptoms; light-colored stools; loss of appetite; nausea; right upper belly pain; unusually weak or tired; yellowing of the eyes or skin -slow heartbeat or palpitations -unusual bleeding or bruising -vomiting Side effects that usually do not require medical attention (report to your doctor or health care professional if they continue or are bothersome): -diarrhea, especially when starting treatment -headache -loss of appetite -muscle cramps -nausea -stomach upset This list may not describe all possible side effects. Call your doctor for medical advice about side effects. You may report side effects to FDA at 1-800-FDA-1088. Where should I keep my medicine? Keep  out of reach of children. Store at room temperature between 15 and 30 degrees C (59 and 86 degrees F). Throw away any unused medicine after the expiration date. NOTE: This sheet is a summary. It may not cover all possible information. If you have questions about this medicine, talk to your doctor, pharmacist, or health care provider.  2018 Elsevier/Gold Standard (2015-09-26 21:00:42)
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
## 2016-09-05 ENCOUNTER — Ambulatory Visit: Payer: Medicare Other

## 2016-09-05 ENCOUNTER — Telehealth: Payer: Self-pay | Admitting: Neurology

## 2016-09-05 ENCOUNTER — Encounter: Payer: Self-pay | Admitting: Family

## 2016-09-05 DIAGNOSIS — F0390 Unspecified dementia without behavioral disturbance: Secondary | ICD-10-CM | POA: Insufficient documentation

## 2016-09-05 NOTE — Telephone Encounter (Signed)
Returned call to pt's daughter. Let her know that [facility] Driver Re-examination form was completed. Dtr's name and number listed on form to contact per her request. Awaiting MD signature.

## 2016-09-05 NOTE — Telephone Encounter (Signed)
Pt's daughter called the office reg how to get it started for the pt to take a driving test. She is wanting to know who initiates this-her or the provider. Please call

## 2016-09-11 ENCOUNTER — Telehealth: Payer: Self-pay | Admitting: *Deleted

## 2016-09-11 NOTE — Telephone Encounter (Addendum)
Medical records from [facility] on [place].

## 2016-09-12 ENCOUNTER — Telehealth: Payer: Self-pay | Admitting: Family

## 2016-09-12 NOTE — Telephone Encounter (Signed)
Spoke with Mardene Celeste daughter patient has upcoming appointment in a couple of weeks.    Concerned about anxiety and depression and new diagnosis of alzheimers.  Dr Jaynee Eagles prescribed Aricept .   One day she had stomach cramping ,and one day headache and then went away ? Side effect of medication .   Neuropathy seems to have worsened.   Since the new diagnosis dementia talking about taking her car.Saw Dr . Jaynee Eagles neurologist in [place] wondering if you have reviewed note.   Would like to speak with you prior to  appointment.  Wondering if they should move up appointment.  Please advise.

## 2016-09-12 NOTE — Telephone Encounter (Signed)
Pt daughters called and wanted to go over some medication and wanted to discuss some side effects that the patient is having. Please advise, thank you!  Call [facility] @ [phone]

## 2016-09-19 ENCOUNTER — Telehealth: Payer: Self-pay | Admitting: Family

## 2016-09-19 NOTE — Telephone Encounter (Signed)
Left message for patent's daughter. Advised her if any acute issues, please call me back; otherwise I'm fine with seeing patient and discussing medications on office visit 6/5

## 2016-09-19 NOTE — Telephone Encounter (Signed)
[place] [phone] called said the pt did not date medical release, she will need that refax with the date.

## 2016-09-20 DIAGNOSIS — Z85828 Personal history of other malignant neoplasm of skin: Secondary | ICD-10-CM | POA: Diagnosis not present

## 2016-09-25 ENCOUNTER — Ambulatory Visit (INDEPENDENT_AMBULATORY_CARE_PROVIDER_SITE_OTHER): Payer: Medicare Other | Admitting: Family

## 2016-09-25 ENCOUNTER — Encounter: Payer: Self-pay | Admitting: Family

## 2016-09-25 VITALS — BP 124/64 | HR 62 | Temp 98.1°F | Ht 62.0 in | Wt 110.6 lb

## 2016-09-25 DIAGNOSIS — F0281 Dementia in other diseases classified elsewhere with behavioral disturbance: Secondary | ICD-10-CM

## 2016-09-25 DIAGNOSIS — I700 Atherosclerosis of aorta: Secondary | ICD-10-CM | POA: Diagnosis not present

## 2016-09-25 DIAGNOSIS — G309 Alzheimer's disease, unspecified: Secondary | ICD-10-CM

## 2016-09-25 MED ORDER — MIRTAZAPINE 15 MG PO TABS: 15.0000 mg | ORAL_TABLET | Freq: Every day | ORAL | 0 refills | Status: DC

## 2016-09-25 NOTE — Progress Notes (Signed)
Pre visit review using our clinic review tool, if applicable. No additional management support is needed unless otherwise documented below in the visit note. 

## 2016-09-25 NOTE — Progress Notes (Signed)
Subjective:    Patient ID: Maria Bass, female    DOB: 1930/03/22, 83 y.o.   MRN: [id number]  CC: Maria Bass is a 83 y.o. female who presents today for follow up.   HPI: Recently diagnosed with alzheimer's dementia  Both daughter's here today. Concern for safety of driving and do not want mother driving. Per Dr Cathren Laine last note, she has concerns as well. Plans to start namenda at next appointment.   Daughters report some improvement on aricept however describe mother as 'more irritable.'   Patient has noticed leg cramps and feels weaker in legs. Suspect related to liptor.  No falls.   Concern for lack of appetite.    Also concern for atherosclerosis seen on CT Chest. Denies exertional chest pain or pressure, numbness or tingling radiating to left arm or jaw, palpitations, dizziness, frequent headaches, changes in vision, or shortness of breath.   Trouble staying asleep. Would like to go back on remeron 45mg .    Family has completed POA.      HISTORY:  Past Medical History:  Diagnosis Date  . Anxiety   . Depression   . Hyperlipidemia   . Hypertension   . IBS (irritable bowel syndrome)    per pt- not dx as IBS, but has IBS "symptoms"  . MAI (mycobacterium avium-intracellulare) ([place]) 2003   treated at [facility]  . Skin cancer    Past Surgical History:  Procedure Laterality Date  . ABDOMINAL HYSTERECTOMY    . BLADDER REPAIR    . CATARACT EXTRACTION, BILATERAL    . TONSILLECTOMY     Family History  Problem Relation Age of Onset  . Heart attack Mother   . Heart attack Father   . Diabetes Father   . Colon cancer Neg Hx   . Esophageal cancer Neg Hx   . Stomach cancer Neg Hx     Allergies: Patient has no known allergies. Current Outpatient Prescriptions on File Prior to Visit  Medication Sig Dispense Refill  . Ascorbic Acid (VITAMIN C) 1000 MG tablet Take 1,000 mg by mouth daily.      [name] aspirin EC 81 MG tablet Take 81 mg by mouth daily.    [name]  atorvastatin (LIPITOR) 40 MG tablet Take 1 tablet (40 mg total) by mouth daily. 90 tablet 0  . Biotin 1000 MCG tablet Take 5,000 mcg by mouth daily.     . cholecalciferol (VITAMIN D) 1000 UNITS tablet Take 1,000 Units by mouth daily.      [name] donepezil (ARICEPT) 10 MG tablet Start with 1/2 pill for one week then increase to a whole pill daily 30 tablet 11  . hyoscyamine (LEVSIN SL) 0.125 MG SL tablet Place 1 tablet (0.125 mg total) under the tongue every 4 (four) hours as needed for cramping. 90 tablet 3  . losartan (COZAAR) 50 MG tablet TAKE 1 TABLET DAILY 90 tablet 0   No current facility-administered medications on file prior to visit.     Social History  Substance Use Topics  . Smoking status: Former Smoker    Types: Cigarettes    Quit date: 04/23/1978  . Smokeless tobacco: Never Used     Comment: social smoker x 15  . Alcohol use 4.2 oz/week    7 Glasses of wine per week     Comment: glass a wine at night    Review of Systems  Constitutional: Negative for chills and fever.  Eyes: Negative for visual disturbance.  Respiratory: Negative for cough  and shortness of breath.   Cardiovascular: Negative for chest pain and palpitations.  Gastrointestinal: Negative for nausea and vomiting.  Neurological: Positive for weakness. Negative for dizziness and headaches.  Psychiatric/Behavioral: Positive for confusion and sleep disturbance.      Objective:    BP 124/64   Pulse 62   Temp 98.1 F (36.7 C) (Oral)   Ht 5\' 2"  (1.575 m)   Wt 110 lb 9.6 oz (50.2 kg)   SpO2 96%   BMI 20.23 kg/m  BP Readings from Last 3 Encounters:  09/25/16 124/64  08/30/16 129/67  08/14/16 110/68   Wt Readings from Last 3 Encounters:  09/25/16 110 lb 9.6 oz (50.2 kg)  08/30/16 114 lb 6.4 oz (51.9 kg)  08/14/16 114 lb 9.6 oz (52 kg)    Physical Exam  Constitutional: She appears well-developed and well-nourished.  Eyes: Conjunctivae are normal.  Cardiovascular: Normal rate, regular rhythm, normal  heart sounds and normal pulses.   Pulmonary/Chest: Effort normal and breath sounds normal. She has no wheezes. She has no rhonchi. She has no rales.  Neurological: She is alert.  Skin: Skin is warm and dry.  Psychiatric: She has a normal mood and affect. Her speech is normal and behavior is normal. Thought content normal.  Vitals reviewed.      Assessment & Plan:   Problem List Items Addressed This Visit      Cardiovascular and Mediastinum   Atherosclerosis of aorta (HCC)    Clinically asymptomatic. Pending US carotid. Declines referral to cardiology at this time. Significant coronary and thoracic atherosclerosis seen on CT chest. Discussed muscle aches likely related to lipitor, advised trialing once per week and increasing from there. Next visit will get lipid panel ( LDL goal < 70)      Relevant Orders   US Carotid Duplex Bilateral     Nervous and Auditory   Alzheimer's dementia - Primary    Some improvement on aricept. Following with ahern. Discussed and agreed with daughters regarding concern for driving and will complete paperwork. Discussed nutrition ( lost 4 pounds) and physical therapy referrals. Follow up in 3 months. Had been on remeron in the past for sleep; will start again. Close monitoring.       Relevant Medications   mirtazapine (REMERON) 15 MG tablet   Other Relevant Orders   Ambulatory referral to Physical Therapy   Referral to Nutrition and Diabetes Services       I am having Ms. Schnee start on mirtazapine. I am also having her maintain her Biotin, cholecalciferol, vitamin C, hyoscyamine, losartan, atorvastatin, aspirin EC, and donepezil.   Meds ordered this encounter  Medications  . mirtazapine (REMERON) 15 MG tablet    Sig: Take 1 tablet (15 mg total) by mouth at bedtime.    Dispense:  30 tablet    Refill:  0    Order Specific Question:   Supervising Provider    Answer:   Crecencio Mc [[id number]]    Return precautions given.   Risks, benefits,  and alternatives of the medications and treatment plan prescribed today were discussed, and patient expressed understanding.   Education regarding symptom management and diagnosis given to patient on AVS.  Continue to follow with Burnard Hawthorne, [name] for routine health maintenance.   Eyvonne Mechanic and I agreed with plan.   Mable Paris, [name]  Total of 25 minutes spent with patient, greater than 50% of which was spent in discussion of coordination of care, consults, and holistic  planning with dementia diagnosis.

## 2016-09-25 NOTE — Assessment & Plan Note (Addendum)
Some improvement on aricept. Following with ahern. Discussed and agreed with daughters regarding concern for driving and will complete paperwork. Discussed nutrition ( lost 4 pounds) and physical therapy referrals. Follow up in 3 months. Had been on remeron in the past for sleep; will start again. Close monitoring.

## 2016-09-25 NOTE — Patient Instructions (Addendum)
Physical therapy, nutritionist.   US carotid   Will discuss cardiology at follow up  Boost / Ensure    Trial of taking lipitor once per week and then increase to twice per week if tolerating.   Remeron as needed for sleep. May increase dose however let's start at 15mg .   F/u 3 months

## 2016-09-25 NOTE — Assessment & Plan Note (Addendum)
Clinically asymptomatic. Pending US carotid. Declines referral to cardiology at this time. Significant coronary and thoracic atherosclerosis seen on CT chest. Discussed muscle aches likely related to lipitor, advised trialing once per week and increasing from there. Next visit will get lipid panel ( LDL goal < 70)

## 2016-09-28 NOTE — Telephone Encounter (Signed)
Received notes from medical visits 2006-2013 w/ Dr. Arrie Eastern for insomnia, hypertension, hyperlipidemia, osteopenia, MAI disease, immunizations, GI issues, skin lesion excisions and other imaging studies. Sent to med records for scanning, copy to Dr. Jaynee Eagles for review.

## 2016-10-04 ENCOUNTER — Telehealth: Payer: Self-pay | Admitting: *Deleted

## 2016-10-04 NOTE — Telephone Encounter (Signed)
Patient's daughter requested to have a Rx sent over to the [place] for physical therapy and a nutritionist  Fax [phone] Atten: Cecille Rubin

## 2016-10-04 NOTE — Telephone Encounter (Signed)
Please advise for order.

## 2016-10-05 NOTE — Telephone Encounter (Signed)
The referral for PT was sent to [facility], nutritionist was sent to [facility] since there was no mention where to send it. I have sent it to them.

## 2016-10-05 NOTE — Telephone Encounter (Signed)
Maria Bass,  These referrals were both placed 6/5.  Can we make sure they go to [facility] [place]?

## 2016-10-08 ENCOUNTER — Telehealth: Payer: Self-pay | Admitting: Neurology

## 2016-10-08 ENCOUNTER — Telehealth: Payer: Self-pay | Admitting: *Deleted

## 2016-10-08 NOTE — Telephone Encounter (Signed)
Pt's daughter said pt has been doing well on aricept but she has started having mood swings for the past 2 weeks. Daughter said everything is a big deal to the patient and she is anxious, some paranoia, some anger, confusion, forgetfulness. She has been taken off statin medication due to leg cramps and that was big deal.

## 2016-10-08 NOTE — Telephone Encounter (Signed)
Patient has requested to see a orthopedic before 10/11/16. Patient is scheduled to fly out for vacation on 10/11/16, however pt has injured both feet and requested to have her feet looked at to determine if she should fly. Pt left foot was injured during a hiking trip , leaving her right foot with soreness. No objects caused injury, this resulted to extreme walking . Pt contact [phone]

## 2016-10-08 NOTE — Telephone Encounter (Signed)
New pt seen 08/30/16 for dementia. She resides at independent living facility. Driving eval recommended and pt was started on Aricept. She has follow-up scheduled in August w/ plans to add Namenda at that time. She saw her [name] on 09/25/16. Daughters report some improvement on Aricept but said that pt was more irritable. Carotid doppler was ordered and pt was referred to PT and nutrition therapy. Lipitor was stopped d/t leg cramps and Remeron was started for sleep. Called daughter Mardene Celeste) and left VM mssg to call back to discuss further. Did talk w/ daughter, Peg, who said that pt had a better day today. Reports that she's not using Remeron regularly which can have side effects of agitation, hallucinations, etc, more likely than w/ Aricept. Daughter voiced understanding and feels that pt will be fine to keep f/u appt in Aug as scheduled w/ Dr. Jaynee Eagles.

## 2016-10-08 NOTE — Telephone Encounter (Signed)
Thanks & noted

## 2016-10-08 NOTE — Telephone Encounter (Signed)
Sounds like patient would like to have an ortho referral. Please advise.

## 2016-10-09 NOTE — Telephone Encounter (Signed)
Call pt  Since she is leaving so soon  - I would advise her going to   If your symptom do not improve, you may go to walk in orthopedic clinic. Information below:  [facility] [address]  M-F 1-7:30pm  [phone]  OR if I have an available appt, please advise her to come in. And remember, I asked for 17min slots for her**

## 2016-10-09 NOTE — Telephone Encounter (Signed)
Left message for patient to return call back.  

## 2016-10-11 NOTE — Telephone Encounter (Signed)
Patient SX has greatly improved. Patient went on vacation.

## 2016-10-14 ENCOUNTER — Encounter: Payer: Self-pay | Admitting: Family

## 2016-10-15 ENCOUNTER — Encounter: Payer: Self-pay | Admitting: Family

## 2016-10-15 ENCOUNTER — Other Ambulatory Visit: Payer: Self-pay

## 2016-10-15 NOTE — Telephone Encounter (Signed)
Patients Nurse Ival Bible, RN would like to have an order for patient to go to Physical therapy due to her walking gate and legs. Fax#: [phone] Tel [phone]. Please advise.

## 2016-10-16 ENCOUNTER — Ambulatory Visit: Payer: Medicare Other

## 2016-10-18 ENCOUNTER — Other Ambulatory Visit: Payer: Self-pay | Admitting: Family

## 2016-10-22 ENCOUNTER — Other Ambulatory Visit: Payer: Self-pay | Admitting: Family

## 2016-10-22 DIAGNOSIS — M79672 Pain in left foot: Principal | ICD-10-CM

## 2016-10-22 DIAGNOSIS — M79671 Pain in right foot: Secondary | ICD-10-CM

## 2016-10-25 ENCOUNTER — Telehealth: Payer: Self-pay | Admitting: Family

## 2016-10-25 DIAGNOSIS — M6281 Muscle weakness (generalized): Secondary | ICD-10-CM | POA: Diagnosis not present

## 2016-10-25 NOTE — Telephone Encounter (Signed)
Maria Bass has already done referral and patient has appointment scheduled.  Patient also would like to speak to [facility] about her medication and how she is no longer having cramps.

## 2016-10-25 NOTE — Telephone Encounter (Signed)
Pt called and wanted to speak with M. [name] in regards to a referral for a podiatrist and an update on medication on she had given her. Please advise, thank you!  Call pt @ [phone]

## 2016-10-29 ENCOUNTER — Encounter: Payer: Self-pay | Admitting: Family

## 2016-10-29 ENCOUNTER — Ambulatory Visit (INDEPENDENT_AMBULATORY_CARE_PROVIDER_SITE_OTHER): Payer: Medicare Other | Admitting: Family

## 2016-10-29 VITALS — BP 136/70 | HR 66 | Temp 98.0°F | Ht 62.0 in | Wt 110.4 lb

## 2016-10-29 DIAGNOSIS — I350 Nonrheumatic aortic (valve) stenosis: Secondary | ICD-10-CM

## 2016-10-29 DIAGNOSIS — M6281 Muscle weakness (generalized): Secondary | ICD-10-CM | POA: Diagnosis not present

## 2016-10-29 DIAGNOSIS — G4700 Insomnia, unspecified: Secondary | ICD-10-CM | POA: Diagnosis not present

## 2016-10-29 DIAGNOSIS — F0281 Dementia in other diseases classified elsewhere with behavioral disturbance: Secondary | ICD-10-CM

## 2016-10-29 DIAGNOSIS — G629 Polyneuropathy, unspecified: Secondary | ICD-10-CM | POA: Insufficient documentation

## 2016-10-29 DIAGNOSIS — G309 Alzheimer's disease, unspecified: Secondary | ICD-10-CM

## 2016-10-29 DIAGNOSIS — I700 Atherosclerosis of aorta: Secondary | ICD-10-CM | POA: Diagnosis not present

## 2016-10-29 MED ORDER — MIRTAZAPINE 45 MG PO TABS: 45.0000 mg | ORAL_TABLET | Freq: Every day | ORAL | 1 refills | Status: DC

## 2016-10-29 NOTE — Assessment & Plan Note (Addendum)
New ( to me). No gross deficits on exam. Advised to continue PT for balance, strength. Advised OTC capsaicin.

## 2016-10-29 NOTE — Progress Notes (Signed)
Subjective:    Patient ID: Maria Bass, female    DOB: 04/09/1930, 83 y.o.   MRN: [id number]  CC: Maria Bass is a 84 y.o. female who presents today for follow up.   HPI:  Atherosclerosis-currently on Lipitor, taking 2 days per week. Tolerating well at this time. No muscle aches. Pending Carotid US   Complains of Chronic neuropathy since 2010, unchanged. Intermittent. Numbness in bilateral feet. Not in legs. Struggling with loss of balance. No falls. Started PT with intentions to focus on balance, strength. Had first session today.  Complains of 'toes overlapping' and being painful at time. Chronic bunions, splay feet. Had been seen in '90s in [facility] by orthopedic. Decided not to go to podiatry a this time.   Sleep - on remeron and not helping. Thinks dose is too low. Had been on 45mg . Uses only as needed and not needed every night.    Still driving. Hasn't gotten lost, no wrecks.   Known murmur. No dizziness, syncope, chest pain.           07/2016 b12, TSH normal. Random glucose 101.   Echo 2016 [place]. EF normal.  Moderate aortic stenosis.  HISTORY:  Past Medical History:  Diagnosis Date  . Anxiety   . Depression   . Hyperlipidemia   . Hypertension   . IBS (irritable bowel syndrome)    per pt- not dx as IBS, but has IBS "symptoms"  . MAI (mycobacterium avium-intracellulare) ([place]) 2003   treated at [facility]  . Skin cancer    Past Surgical History:  Procedure Laterality Date  . ABDOMINAL HYSTERECTOMY    . BLADDER REPAIR    . CATARACT EXTRACTION, BILATERAL    . TONSILLECTOMY     Family History  Problem Relation Age of Onset  . Heart attack Mother   . Heart attack Father   . Diabetes Father   . Colon cancer Neg Hx   . Esophageal cancer Neg Hx   . Stomach cancer Neg Hx     Allergies: Patient has no known allergies. Current Outpatient Prescriptions on File Prior to Visit  Medication Sig Dispense Refill  . Ascorbic Acid (VITAMIN C) 1000 MG tablet Take  1,000 mg by mouth daily.      [name] aspirin EC 81 MG tablet Take 81 mg by mouth daily.    [name] atorvastatin (LIPITOR) 40 MG tablet Take 1 tablet (40 mg total) by mouth daily. 90 tablet 0  . Biotin 1000 MCG tablet Take 5,000 mcg by mouth daily.     . cholecalciferol (VITAMIN D) 1000 UNITS tablet Take 1,000 Units by mouth daily.      [name] donepezil (ARICEPT) 10 MG tablet Start with 1/2 pill for one week then increase to a whole pill daily 30 tablet 11  . hyoscyamine (LEVSIN SL) 0.125 MG SL tablet Place 1 tablet (0.125 mg total) under the tongue every 4 (four) hours as needed for cramping. 90 tablet 3  . losartan (COZAAR) 50 MG tablet TAKE 1 TABLET DAILY 90 tablet 0  . mirtazapine (REMERON) 15 MG tablet Take 1 tablet (15 mg total) by mouth at bedtime. 30 tablet 0   No current facility-administered medications on file prior to visit.     Social History  Substance Use Topics  . Smoking status: Former Smoker    Types: Cigarettes    Quit date: 04/23/1978  . Smokeless tobacco: Never Used     Comment: social smoker x 15  . Alcohol use 4.2  Bass/week    7 Glasses of wine per week     Comment: glass a wine at night    Review of Systems  Constitutional: Negative for chills and fever.  Respiratory: Negative for cough.   Cardiovascular: Negative for chest pain and palpitations.  Gastrointestinal: Negative for nausea and vomiting.  Neurological: Positive for numbness. Negative for dizziness and syncope.  Psychiatric/Behavioral: Positive for sleep disturbance.      Objective:    BP 136/70   Pulse 66   Temp 98 F (36.7 C) (Oral)   Ht 5\' 2"  (1.575 m)   Wt 110 lb 6.4 Bass (50.1 kg)   SpO2 97%   BMI 20.19 kg/m  BP Readings from Last 3 Encounters:  10/29/16 136/70  09/25/16 124/64  08/30/16 129/67   Wt Readings from Last 3 Encounters:  10/29/16 110 lb 6.4 Bass (50.1 kg)  09/25/16 110 lb 9.6 Bass (50.2 kg)  08/30/16 114 lb 6.4 Bass (51.9 kg)    Physical Exam  Constitutional: She appears well-developed  and well-nourished.  Eyes: Conjunctivae are normal.  Cardiovascular: Normal rate, regular rhythm and normal pulses.   Murmur heard.  Systolic murmur is present with a grade of 2/6  SEM II/VI, Loudest LSB, non radiating, no thrill   Pulmonary/Chest: Effort normal and breath sounds normal. She has no wheezes. She has no rhonchi. She has no rales.  Neurological: She is alert.  Microfilament test- sensation intact bilateral feet.  Overlapping toes. Obvious bunion bilateral feet.   Skin: Skin is warm and dry.  Psychiatric: She has a normal mood and affect. Her speech is normal and behavior is normal. Thought content normal.  Vitals reviewed.      Assessment & Plan:   Problem List Items Addressed This Visit      Cardiovascular and Mediastinum   Aortic valve stenosis    Currently asymptomatic. We'll continue to monitor      Atherosclerosis of aorta (HCC) - Primary    Tolerating lipitor. Pending lipid panel, US carotid. Will follow.       Relevant Orders   Lipid panel   US Carotid Duplex Bilateral     Nervous and Auditory   Alzheimer's dementia    Stable. Continues to follow with ahern. Weight stable.       Neuropathy    No gross deficits on exam. Advised to continue PT for balance, strength. Advised OTC capsaicin.         Other   Insomnia    Periodic. Will increase remeron to prior dose to see if better control.           I am having Maria Bass maintain her Biotin, cholecalciferol, vitamin C, hyoscyamine, losartan, atorvastatin, aspirin EC, donepezil, and mirtazapine.   No orders of the defined types were placed in this encounter.   Return precautions given.   Risks, benefits, and alternatives of the medications and treatment plan prescribed today were discussed, and patient expressed understanding.   Education regarding symptom management and diagnosis given to patient on AVS.  Continue to follow with Burnard Hawthorne, [name] for routine health maintenance.     Eyvonne Mechanic and I agreed with plan.   Mable Paris, [name]

## 2016-10-29 NOTE — Assessment & Plan Note (Signed)
Tolerating lipitor. Pending lipid panel, US carotid. Will follow.

## 2016-10-29 NOTE — Assessment & Plan Note (Signed)
Currently asymptomatic. We'll continue to monitor

## 2016-10-29 NOTE — Patient Instructions (Addendum)
Come back for fasting lipid lab draw. You may do this one morning at your convenience. Please ensure you do call and make a LAB  appointment ahead of time.   I would advise you to refrain from driving and allow the Village to drive you.  I just want you and other around you to be safe.   Would like to see how physical therapy works out. Hopeful this will help with balance, strength  Increased remeron to 45mg  at night ( from the 15mg )  For neuropathy on feet, I would advise you to try capsaicin topical cream. You can find this over the counter. If desnt help, I would advise you to speak with Dr Jaynee Eagles about starting a prescription medicaiton for neuropathy.  Follow up in 3 months.

## 2016-10-29 NOTE — Assessment & Plan Note (Signed)
Periodic. Will increase remeron to prior dose to see if better control.

## 2016-10-29 NOTE — Progress Notes (Signed)
Pre visit review using our clinic review tool, if applicable. No additional management support is needed unless otherwise documented below in the visit note. 

## 2016-10-29 NOTE — Assessment & Plan Note (Signed)
Stable. Continues to follow with ahern. Weight stable.

## 2016-10-31 DIAGNOSIS — M6281 Muscle weakness (generalized): Secondary | ICD-10-CM | POA: Diagnosis not present

## 2016-11-02 DIAGNOSIS — X32XXXA Exposure to sunlight, initial encounter: Secondary | ICD-10-CM | POA: Diagnosis not present

## 2016-11-02 DIAGNOSIS — D692 Other nonthrombocytopenic purpura: Secondary | ICD-10-CM | POA: Diagnosis not present

## 2016-11-02 DIAGNOSIS — L570 Actinic keratosis: Secondary | ICD-10-CM | POA: Diagnosis not present

## 2016-11-02 DIAGNOSIS — Z08 Encounter for follow-up examination after completed treatment for malignant neoplasm: Secondary | ICD-10-CM | POA: Diagnosis not present

## 2016-11-02 DIAGNOSIS — Z85828 Personal history of other malignant neoplasm of skin: Secondary | ICD-10-CM | POA: Diagnosis not present

## 2016-11-05 DIAGNOSIS — M6281 Muscle weakness (generalized): Secondary | ICD-10-CM | POA: Diagnosis not present

## 2016-11-07 DIAGNOSIS — M6281 Muscle weakness (generalized): Secondary | ICD-10-CM | POA: Diagnosis not present

## 2016-11-08 ENCOUNTER — Telehealth: Payer: Self-pay

## 2016-11-08 NOTE — Telephone Encounter (Signed)
Called and r/s'd appt time to 4 pm per pt/dtr request.

## 2016-11-11 DIAGNOSIS — S99912A Unspecified injury of left ankle, initial encounter: Secondary | ICD-10-CM | POA: Diagnosis not present

## 2016-11-11 DIAGNOSIS — S93402A Sprain of unspecified ligament of left ankle, initial encounter: Secondary | ICD-10-CM | POA: Diagnosis not present

## 2016-11-12 DIAGNOSIS — S8262XA Displaced fracture of lateral malleolus of left fibula, initial encounter for closed fracture: Secondary | ICD-10-CM | POA: Diagnosis not present

## 2016-11-15 ENCOUNTER — Other Ambulatory Visit: Payer: Self-pay | Admitting: Family

## 2016-11-19 ENCOUNTER — Ambulatory Visit: Payer: Medicare Other | Admitting: Podiatry

## 2016-11-27 ENCOUNTER — Ambulatory Visit (INDEPENDENT_AMBULATORY_CARE_PROVIDER_SITE_OTHER): Payer: Medicare Other | Admitting: Neurology

## 2016-11-27 ENCOUNTER — Encounter: Payer: Self-pay | Admitting: Neurology

## 2016-11-27 VITALS — BP 100/63 | HR 63 | Ht 62.0 in | Wt 108.8 lb

## 2016-11-27 DIAGNOSIS — R7309 Other abnormal glucose: Secondary | ICD-10-CM | POA: Diagnosis not present

## 2016-11-27 DIAGNOSIS — F0391 Unspecified dementia with behavioral disturbance: Secondary | ICD-10-CM

## 2016-11-27 DIAGNOSIS — G629 Polyneuropathy, unspecified: Secondary | ICD-10-CM

## 2016-11-27 MED ORDER — MEMANTINE HCL ER 7 & 14 & 21 &28 MG PO CP24: 1.0000 | ORAL_CAPSULE | Freq: Every day | ORAL | 0 refills | Status: DC

## 2016-11-27 MED ORDER — MEMANTINE HCL ER 28 MG PO CP24: 28.0000 mg | ORAL_CAPSULE | Freq: Every day | ORAL | 5 refills | Status: DC

## 2016-11-27 NOTE — Patient Instructions (Signed)
Memantine extended release capsules What is this medicine? MEMANTINE (MEM an teen) is used to treat dementia caused by Alzheimer's disease. This medicine may be used for other purposes; ask your health care provider or pharmacist if you have questions. COMMON BRAND NAME(S): Namenda XR What should I tell my health care provider before I take this medicine? They need to know if you have any of these conditions: -difficulty passing urine -kidney disease -liver disease -seizures -an unusual or allergic reaction to memantine, other medicines, foods, dyes, or preservatives -pregnant or trying to get pregnant -breast-feeding How should I use this medicine? Take this medicine by mouth with a glass of water. Follow the directions on the prescription label. You may take this medicine with or without food. You may swallow the capsules whole or open them and sprinkle the entire contents on applesauce before swallowing. Other than sprinkling the medicine on applesauce, the capsules should be swallowed whole and not divided, chewed, or crushed. Take your doses at regular intervals. Do not take your medicine more often than directed. Continue to take your medicine even if you feel better. Do not stop taking except on the advice of your doctor or health care professional. Talk to your pediatrician regarding the use of this medicine in children. Special care may be needed. Overdosage: If you think you have taken too much of this medicine contact a poison control center or emergency room at once. NOTE: This medicine is only for you. Do not share this medicine with others. What if I miss a dose? If you miss a dose, take it as soon as you can. If it is almost time for your next dose, take only that dose. Do not take double or extra doses. If you do not take your medicine for several days, contact your health care provider. Your dose may need to be changed. What may interact with this  medicine? -acetazolamide -amantadine -cimetidine -dextromethorphan -dofetilide -hydrochlorothiazide -ketamine -metformin -methazolamide -quinidine -ranitidine -sodium bicarbonate -triamterene This list may not describe all possible interactions. Give your health care provider a list of all the medicines, herbs, non-prescription drugs, or dietary supplements you use. Also tell them if you smoke, drink alcohol, or use illegal drugs. Some items may interact with your medicine. What should I watch for while using this medicine? Visit your doctor or health care professional for regular checks on your progress. Check with your doctor or health care professional if there is no improvement in your symptoms or if they get worse. You may get drowsy or dizzy. Do not drive, use machinery, or do anything that needs mental alertness until you know how this drug affects you. Do not stand or sit up quickly, especially if you are an older patient. This reduces the risk of dizzy or fainting spells. Alcohol can make you more drowsy and dizzy. Avoid alcoholic drinks. What side effects may I notice from receiving this medicine? Side effects that you should report to your doctor or health care professional as soon as possible: -agitation or a feeling of restlessness -allergic reactions like skin rash, itching or hives, swelling of the face, lips, or tongue -depressed mood -dizziness -hallucinations -redness, blistering, peeling or loosening of the skin, including inside the mouth -seizures -vomiting Side effects that usually do not require medical attention (report to your doctor or health care professional if they continue or are bothersome): -constipation -diarrhea -headache -nausea -trouble sleeping This list may not describe all possible side effects. Call your doctor for medical advice about  side effects. You may report side effects to FDA at 1-800-FDA-1088. Where should I keep my medicine? Keep  out of the reach of children. Store at room temperature between 15 degrees and 30 degrees C (59 degrees and 86 degrees F). Throw away any unused medicine after the expiration date. NOTE: This sheet is a summary. It may not cover all possible information. If you have questions about this medicine, talk to your doctor, pharmacist, or health care provider.  2018 Elsevier/Gold Standard (2015-05-12 10:09:47)
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
## 2016-11-29 ENCOUNTER — Other Ambulatory Visit: Payer: Self-pay | Admitting: Neurology

## 2016-11-29 ENCOUNTER — Telehealth: Payer: Self-pay | Admitting: Radiology

## 2016-11-29 ENCOUNTER — Other Ambulatory Visit (INDEPENDENT_AMBULATORY_CARE_PROVIDER_SITE_OTHER): Payer: Medicare Other

## 2016-11-29 DIAGNOSIS — I700 Atherosclerosis of aorta: Secondary | ICD-10-CM

## 2016-11-29 DIAGNOSIS — G629 Polyneuropathy, unspecified: Secondary | ICD-10-CM | POA: Diagnosis not present

## 2016-11-29 DIAGNOSIS — E119 Type 2 diabetes mellitus without complications: Secondary | ICD-10-CM | POA: Diagnosis not present

## 2016-11-29 LAB — LIPID PANEL
CHOL/HDL RATIO: 3
Cholesterol: 180 mg/dL (ref 0–200)
HDL: 53.1 mg/dL (ref >39.00)
LDL Cholesterol: 103 mg/dL — ABNORMAL HIGH (ref 0–99)
NonHDL: 126.57
TRIGLYCERIDES: 118 mg/dL (ref 0.0–149.0)
VLDL: 23.6 mg/dL (ref 0.0–40.0)

## 2016-11-29 NOTE — Telephone Encounter (Signed)
FYI. Pt came in for labs today and stated she was seen at [facility] yesterday by Dr. Sarina Ill. Pt stated that Dr. Jaynee Eagles told her to have labs that he ordered drawn today at our office along with [name] labs. Labs ordered by Dr. Jaynee Eagles were not ordered future and lab staff at [facility] were not able to release labs. Requisitions of the labs ordered by Dr. Jaynee Eagles were printed through the pt chart and labels were cut from requisition and placed on tubes and sent to [place].

## 2016-11-29 NOTE — Telephone Encounter (Signed)
Yes they were drawn

## 2016-11-29 NOTE — Telephone Encounter (Signed)
So in essence- ahern's labs were drawn?   Thanks  [place] for doing that

## 2016-11-29 NOTE — Telephone Encounter (Signed)
Delsa Sale, so is everything ok?

## 2016-12-01 LAB — SPECIMEN STATUS REPORT

## 2016-12-03 ENCOUNTER — Telehealth: Payer: Self-pay | Admitting: Family

## 2016-12-03 LAB — HEAVY METALS, BLOOD
Arsenic: 4 ug/L (ref 2–23)
LEAD, BLOOD: 3 ug/dL (ref 0–4)
Mercury: NOT DETECTED ug/L (ref 0.0–14.9)

## 2016-12-03 LAB — MULTIPLE MYELOMA PANEL, SERUM
ALBUMIN/GLOB SERPL: 1.1 (ref 0.7–1.7)
ALPHA 1: 0.3 g/dL (ref 0.0–0.4)
Albumin SerPl Elph-Mcnc: 3.4 g/dL (ref 2.9–4.4)
Alpha2 Glob SerPl Elph-Mcnc: 0.9 g/dL (ref 0.4–1.0)
B-Globulin SerPl Elph-Mcnc: 1 g/dL (ref 0.7–1.3)
GAMMA GLOB SERPL ELPH-MCNC: 0.9 g/dL (ref 0.4–1.8)
Globulin, Total: 3.1 g/dL (ref 2.2–3.9)
IGA/IMMUNOGLOBULIN A, SERUM: 272 mg/dL (ref 64–422)
IGM (IMMUNOGLOBULIN M), SRM: 36 mg/dL (ref 26–217)
IgG (Immunoglobin G), Serum: 889 mg/dL (ref 700–1600)
Total Protein: 6.5 g/dL (ref 6.0–8.5)

## 2016-12-03 LAB — HEMOGLOBIN A1C
Est. average glucose Bld gHb Est-mCnc: 114 mg/dL
Hgb A1c MFr Bld: 5.6 % (ref 4.8–5.6)

## 2016-12-03 LAB — SPECIMEN STATUS REPORT

## 2016-12-03 NOTE — Telephone Encounter (Signed)
Left pt message asking to call Ebony Hail back directly at [phone] to schedule AWV. Thanks!  *NOTE* Last AWV 08/30/15

## 2016-12-04 LAB — VITAMIN B1

## 2016-12-05 LAB — SPECIMEN STATUS REPORT

## 2016-12-05 LAB — VITAMIN B1

## 2016-12-06 ENCOUNTER — Telehealth: Payer: Self-pay | Admitting: Neurology

## 2016-12-06 NOTE — Telephone Encounter (Signed)
-----   Message from Larey Seat, MD sent at 12/06/2016  5:08 PM EDT ----- No heavy metal poisoning. CD cc Dr Jaynee Eagles

## 2016-12-06 NOTE — Telephone Encounter (Signed)
Called patient with lab results. No answer at this time. Per DPR ok to leave voicemail so I left a voice message stating that lab work looked good with no metal poisoning. I did ask for pt to call back if she had any further questions.

## 2016-12-18 ENCOUNTER — Encounter: Payer: Self-pay | Admitting: Neurology

## 2016-12-18 ENCOUNTER — Encounter: Payer: Self-pay | Admitting: Family

## 2016-12-18 DIAGNOSIS — E785 Hyperlipidemia, unspecified: Secondary | ICD-10-CM

## 2016-12-19 ENCOUNTER — Telehealth: Payer: Self-pay | Admitting: Neurology

## 2016-12-19 DIAGNOSIS — F0390 Unspecified dementia without behavioral disturbance: Secondary | ICD-10-CM

## 2016-12-19 MED ORDER — DONEPEZIL HCL 10 MG PO TABS: ORAL_TABLET | ORAL | 3 refills | Status: DC

## 2016-12-19 MED ORDER — MEMANTINE HCL ER 28 MG PO CP24: 28.0000 mg | ORAL_CAPSULE | Freq: Every day | ORAL | 3 refills | Status: DC

## 2016-12-19 NOTE — Telephone Encounter (Signed)
Pt's daughter request refill for memantine (NAMENDA XR) 28 MG CP24 24 hr capsule sent to [facility]. She said all medications will now go to [facility]. She is requesting to sent today-preferably this morning, please.  Please update. Thank you

## 2016-12-19 NOTE — Addendum Note (Signed)
Addended by: Hope Pigeon on: 12/19/2016 10:46 AM   Modules accepted: Orders

## 2016-12-19 NOTE — Addendum Note (Signed)
Addended by: Hope Pigeon on: 12/19/2016 10:38 AM   Modules accepted: Orders

## 2016-12-19 NOTE — Telephone Encounter (Signed)
Spoke with Dr Jaynee Eagles. Got VO to refill memantine 28mg  CP24 qty 90, 3 refills. E-scribed to [facility].

## 2016-12-19 NOTE — Addendum Note (Signed)
Addended by: Hope Pigeon on: 12/19/2016 10:36 AM   Modules accepted: Orders

## 2016-12-19 NOTE — Telephone Encounter (Signed)
Called daughter back. Advised rx Namenda sent to [facility].  She would also like rx aricept sent to pharmacy. Advised I will send this as well.  She also had questions about lab results from 11/29/16. Advised it looked like Myriam Jacobson, RN spoke with pt, labs normal. She verbalized understanding.

## 2016-12-20 MED ORDER — ATORVASTATIN CALCIUM 40 MG PO TABS: 40.0000 mg | ORAL_TABLET | Freq: Every day | ORAL | 3 refills | Status: DC

## 2016-12-20 MED ORDER — LOSARTAN POTASSIUM 50 MG PO TABS: 50.0000 mg | ORAL_TABLET | Freq: Every day | ORAL | 3 refills | Status: DC

## 2016-12-20 NOTE — Telephone Encounter (Signed)
Medication has been refilled.

## 2017-01-02 NOTE — Telephone Encounter (Signed)
Left pt daughter, patty, message asking to call Ebony Hail back directly at [phone] to schedule AWV. Thanks!  *NOTE* Last AWV 08/30/15

## 2017-01-04 ENCOUNTER — Encounter: Payer: Self-pay | Admitting: Neurology

## 2017-01-04 ENCOUNTER — Encounter: Payer: Self-pay | Admitting: Family

## 2017-01-04 DIAGNOSIS — H04123 Dry eye syndrome of bilateral lacrimal glands: Secondary | ICD-10-CM | POA: Diagnosis not present

## 2017-01-07 ENCOUNTER — Other Ambulatory Visit: Payer: Self-pay

## 2017-01-07 DIAGNOSIS — E785 Hyperlipidemia, unspecified: Secondary | ICD-10-CM

## 2017-01-07 MED ORDER — ATORVASTATIN CALCIUM 40 MG PO TABS: 40.0000 mg | ORAL_TABLET | Freq: Every day | ORAL | 3 refills | Status: DC

## 2017-01-09 ENCOUNTER — Telehealth: Payer: Self-pay | Admitting: Family

## 2017-01-09 ENCOUNTER — Other Ambulatory Visit: Payer: Self-pay

## 2017-01-09 ENCOUNTER — Telehealth: Payer: Self-pay | Admitting: Neurology

## 2017-01-09 DIAGNOSIS — F0390 Unspecified dementia without behavioral disturbance: Secondary | ICD-10-CM

## 2017-01-09 MED ORDER — MEMANTINE HCL ER 28 MG PO CP24
28.0000 mg | ORAL_CAPSULE | Freq: Every day | ORAL | 3 refills | Status: DC
Start: 2017-01-09 — End: 2017-02-21

## 2017-01-09 MED ORDER — DONEPEZIL HCL 10 MG PO TABS
ORAL_TABLET | ORAL | 3 refills | Status: DC
Start: 2017-01-09 — End: 2017-09-06

## 2017-01-09 NOTE — Telephone Encounter (Signed)
Patient's daughter requesting new Rx for memantine (NAMENDA XR) 28 MG CP24 24 hr capsule and donepezil (ARICEPT) 10 MG tablet be sent to [facility] mail order.

## 2017-01-09 NOTE — Telephone Encounter (Signed)
Rx sent to [facility] mail service of aricept ,and namenda.

## 2017-01-09 NOTE — Telephone Encounter (Signed)
Pt daughter called and is requesting a refill on losartan (COZAAR) 50 MG tablet. Please advise, thank you!  Pharmacy - [place], [place] [facility]

## 2017-01-10 ENCOUNTER — Other Ambulatory Visit: Payer: Self-pay

## 2017-01-10 MED ORDER — LOSARTAN POTASSIUM 50 MG PO TABS: 50.0000 mg | ORAL_TABLET | Freq: Every day | ORAL | 3 refills | Status: DC

## 2017-01-12 ENCOUNTER — Encounter: Payer: Self-pay | Admitting: Neurology

## 2017-01-15 DIAGNOSIS — Z23 Encounter for immunization: Secondary | ICD-10-CM | POA: Diagnosis not present

## 2017-01-16 ENCOUNTER — Other Ambulatory Visit: Payer: Self-pay | Admitting: Family

## 2017-01-16 ENCOUNTER — Encounter: Payer: Self-pay | Admitting: Family

## 2017-01-16 DIAGNOSIS — R300 Dysuria: Secondary | ICD-10-CM

## 2017-01-22 DIAGNOSIS — J301 Allergic rhinitis due to pollen: Secondary | ICD-10-CM | POA: Diagnosis not present

## 2017-01-22 DIAGNOSIS — L299 Pruritus, unspecified: Secondary | ICD-10-CM | POA: Diagnosis not present

## 2017-01-22 DIAGNOSIS — J3089 Other allergic rhinitis: Secondary | ICD-10-CM | POA: Diagnosis not present

## 2017-01-28 ENCOUNTER — Telehealth: Payer: Self-pay | Admitting: *Deleted

## 2017-01-28 NOTE — Telephone Encounter (Signed)
Pt's daughter has requested a call from Ledell Noss in reference to upcoming appt. Daughter would like to share some pt information prior to appt Please contact Patti at [phone]

## 2017-01-28 NOTE — Telephone Encounter (Signed)
fyi

## 2017-01-29 ENCOUNTER — Telehealth: Payer: Self-pay | Admitting: Family

## 2017-01-29 NOTE — Telephone Encounter (Signed)
When confirming pt's appt daughter Chong Sicilian stated that she would really like to speak with Joycelyn Schmid today before pt's appt. Pleas advise, thank you!

## 2017-01-29 NOTE — Telephone Encounter (Signed)
Please advise 

## 2017-01-29 NOTE — Telephone Encounter (Signed)
Spoke with both daughters about concern wit mom's anxiety and trying to pay bills. Would like for me to discuss this mom tomorrow.  Advised that I will do so

## 2017-01-30 ENCOUNTER — Encounter: Payer: Self-pay | Admitting: Family

## 2017-01-30 ENCOUNTER — Ambulatory Visit (INDEPENDENT_AMBULATORY_CARE_PROVIDER_SITE_OTHER): Payer: Medicare Other | Admitting: Family

## 2017-01-30 VITALS — BP 106/66 | HR 70 | Temp 98.5°F | Ht 62.0 in | Wt 111.6 lb

## 2017-01-30 DIAGNOSIS — R159 Full incontinence of feces: Secondary | ICD-10-CM

## 2017-01-30 DIAGNOSIS — K580 Irritable bowel syndrome with diarrhea: Secondary | ICD-10-CM | POA: Diagnosis not present

## 2017-01-30 DIAGNOSIS — G309 Alzheimer's disease, unspecified: Secondary | ICD-10-CM

## 2017-01-30 DIAGNOSIS — F0281 Dementia in other diseases classified elsewhere with behavioral disturbance: Secondary | ICD-10-CM | POA: Diagnosis not present

## 2017-01-30 DIAGNOSIS — R300 Dysuria: Secondary | ICD-10-CM

## 2017-01-30 LAB — URINALYSIS, ROUTINE W REFLEX MICROSCOPIC
Bilirubin Urine: NEGATIVE
HGB URINE DIPSTICK: NEGATIVE
Ketones, ur: NEGATIVE
LEUKOCYTES UA: NEGATIVE
NITRITE: NEGATIVE
RBC / HPF: NONE SEEN (ref 0–?)
Specific Gravity, Urine: 1.01 (ref 1.000–1.030)
TOTAL PROTEIN, URINE-UPE24: NEGATIVE
Urine Glucose: NEGATIVE
Urobilinogen, UA: 0.2 (ref 0.0–1.0)
WBC, UA: NONE SEEN (ref 0–?)
pH: 5.5 (ref 5.0–8.0)

## 2017-01-30 NOTE — Progress Notes (Signed)
Subjective:    Patient ID: Maria Bass, female    DOB: 08-06-1929, 83 y.o.   MRN: [id number]  CC: Maria Bass is a 83 y.o. female who presents today for follow up.   HPI: Diarrhea for 2 weeks, unchanged. Notes 'liquid brown', occurs in morning after breakfast and coffee. Resolves by lunch.  Able to make it to the bathroom.  Fecal urgency.  No blood in stool.  No fever, abdominal pain, cramping, dysuria, hematuria.   Complains of anxiety- and daughters have worried about anxiety per patient.  Notes giving responsibiliy to financial to daughters which is hard to do. Describes that when she feels  'anxious", she also notes her 'confusion' is worse.No trouble sleeping.No thoughts of hurting herself or anyone else.     2013 -prytle for similar symptoms; colonscopy 2013;no inflammation, malignancy.  advised to stop colonoscopy based on age at that time.  HISTORY:  Past Medical History:  Diagnosis Date  . Anxiety   . Depression   . Hyperlipidemia   . Hypertension   . IBS (irritable bowel syndrome)    per pt- not dx as IBS, but has IBS "symptoms"  . MAI (mycobacterium avium-intracellulare) ([place]) 2003   treated at [facility]  . Skin cancer    Past Surgical History:  Procedure Laterality Date  . ABDOMINAL HYSTERECTOMY    . BLADDER REPAIR    . CATARACT EXTRACTION, BILATERAL    . TONSILLECTOMY     Family History  Problem Relation Age of Onset  . Heart attack Mother   . Heart attack Father   . Diabetes Father   . Colon cancer Neg Hx   . Esophageal cancer Neg Hx   . Stomach cancer Neg Hx     Allergies: Patient has no known allergies. Current Outpatient Prescriptions on File Prior to Visit  Medication Sig Dispense Refill  . Ascorbic Acid (VITAMIN C) 1000 MG tablet Take 1,000 mg by mouth daily.      [name] aspirin EC 325 MG tablet Take 325 mg by mouth daily.     [name] atorvastatin (LIPITOR) 40 MG tablet Take 1 tablet (40 mg total) by mouth daily. 90 tablet 3  . donepezil  (ARICEPT) 10 MG tablet Start with 1/2 pill for one week then increase to a whole pill daily 90 tablet 3  . hyoscyamine (LEVSIN SL) 0.125 MG SL tablet Place 1 tablet (0.125 mg total) under the tongue every 4 (four) hours as needed for cramping. 90 tablet 3  . losartan (COZAAR) 50 MG tablet Take 1 tablet (50 mg total) by mouth daily. 90 tablet 3  . memantine (NAMENDA XR) 28 MG CP24 24 hr capsule Take 1 capsule (28 mg total) by mouth daily. 90 capsule 3  . mirtazapine (REMERON) 45 MG tablet Take 1 tablet (45 mg total) by mouth at bedtime. 90 tablet 1   No current facility-administered medications on file prior to visit.     Social History  Substance Use Topics  . Smoking status: Former Smoker    Types: Cigarettes    Quit date: 04/23/1978  . Smokeless tobacco: Never Used     Comment: social smoker x 15  . Alcohol use 4.2 Bass/week    7 Glasses of wine per week     Comment: glass a wine at night    Review of Systems  Constitutional: Negative for chills and fever.  Respiratory: Negative for cough.   Cardiovascular: Negative for chest pain and palpitations.  Gastrointestinal: Positive for diarrhea.  Negative for blood in stool, constipation, nausea and vomiting.  Genitourinary: Negative for dysuria, flank pain and frequency.  Psychiatric/Behavioral: Negative for suicidal ideas. The patient is nervous/anxious.       Objective:    BP 106/66   Pulse 70   Temp 98.5 F (36.9 C) (Oral)   Ht 5\' 2"  (1.575 m)   Wt 111 lb 9.6 Bass (50.6 kg)   SpO2 93%   BMI 20.41 kg/m  BP Readings from Last 3 Encounters:  01/30/17 106/66  11/27/16 100/63  10/29/16 136/70   Wt Readings from Last 3 Encounters:  01/30/17 111 lb 9.6 Bass (50.6 kg)  11/27/16 108 lb 12.8 Bass (49.4 kg)  10/29/16 110 lb 6.4 Bass (50.1 kg)    Physical Exam  Constitutional: She appears well-developed and well-nourished.  Eyes: Conjunctivae are normal.  Cardiovascular: Normal rate, regular rhythm, normal heart sounds and normal  pulses.   Pulmonary/Chest: Effort normal and breath sounds normal. She has no wheezes. She has no rhonchi. She has no rales.  Abdominal: Soft. Normal appearance and bowel sounds are normal. She exhibits no distension, no fluid wave, no ascites and no mass. There is no tenderness. There is no rigidity, no rebound, no guarding and no CVA tenderness.  Neurological: She is alert.  Skin: Skin is warm and dry.  Psychiatric: She has a normal mood and affect. Her speech is normal and behavior is normal. Thought content normal.  Vitals reviewed.      Assessment & Plan:   Problem List Items Addressed This Visit      Digestive   Irritable bowel syndrome with diarrhea    Acute on chronic. Review of patient's chart, it appears that she had evaluations for prior diarrhea with very much similar symptoms. Reassured as she is afebrile, no weight loss and No abdominal pain. Pending stool cultures, labs. Will follow.         Nervous and Auditory   Alzheimer's dementia    Cognition stable. However patient and family primaryily concerned for worsening anxiety which causes her to be more "confused". We discussed a starting an SSRI. patient recently advised to stop taking namenda however has stayed on medications. Messaged r Jaynee Eagles about suggestions for anxiolytic for patient. Will let patient know when I hear back.  Note: pending urine studies; patient without symptoms.         Other   Stool incontinence - Primary   Relevant Orders   Celiac Disease Ab Screen w/Rfx (Completed)   Fecal occult blood, imunochemical   C. difficile GDH and Toxin A/B   Gastrointestinal Pathogen Panel PCR    Other Visit Diagnoses    Dysuria           I am having [name] maintain her vitamin C, hyoscyamine, aspirin EC, mirtazapine, atorvastatin, donepezil, memantine, and losartan.   No orders of the defined types were placed in this encounter.   Return precautions given.   Risks, benefits, and alternatives of  the medications and treatment plan prescribed today were discussed, and patient expressed understanding.   Education regarding symptom management and diagnosis given to patient on AVS.  Continue to follow with Burnard Hawthorne, [name] for routine health maintenance.   Eyvonne Mechanic and I agreed with plan.   Mable Paris, [name]

## 2017-01-30 NOTE — Progress Notes (Signed)
Pre visit review using our clinic review tool, if applicable. No additional management support is needed unless otherwise documented below in the visit note. 

## 2017-01-30 NOTE — Patient Instructions (Addendum)
Per Dr Jaynee Eagles, advised to [facility].   Stool studies  Will await on dr Jaynee Eagles prior to starting medication for anxiety  F/u 3 months   Food Choices to Help Relieve Diarrhea, Adult When you have diarrhea, the foods you eat and your eating habits are very important. Choosing the right foods and drinks can help:  Relieve diarrhea.  Replace lost fluids and nutrients.  Prevent dehydration.  What general guidelines should I follow? Relieving diarrhea  Choose foods with less than 2 g or .07 oz. of fiber per serving.  Limit fats to less than 8 tsp (38 g or 1.34 oz.) a day.  Avoid the following: ? Foods and beverages sweetened with high-fructose corn syrup, honey, or sugar alcohols such as xylitol, sorbitol, and mannitol. ? Foods that contain a lot of fat or sugar. ? Fried, greasy, or spicy foods. ? High-fiber grains, breads, and cereals. ? Raw fruits and vegetables.  Eat foods that are rich in probiotics. These foods include dairy products such as yogurt and fermented milk products. They help increase healthy bacteria in the stomach and intestines (gastrointestinal tract, or GI tract).  If you have lactose intolerance, avoid dairy products. These may make your diarrhea worse.  Take medicine to help stop diarrhea (antidiarrheal medicine) only as told by your health care provider. Replacing nutrients  Eat small meals or snacks every 3-4 hours.  Eat bland foods, such as white rice, toast, or baked potato, until your diarrhea starts to get better. Gradually reintroduce nutrient-rich foods as tolerated or as told by your health care provider. This includes: ? Well-cooked protein foods. ? Peeled, seeded, and soft-cooked fruits and vegetables. ? Low-fat dairy products.  Take vitamin and mineral supplements as told by your health care provider. Preventing dehydration   Start by sipping water or a special solution to prevent dehydration (oral rehydration solution, ORS). Urine that  is clear or pale yellow means that you are getting enough fluid.  Try to drink at least 8-10 cups of fluid each day to help replace lost fluids.  You may add other liquids in addition to water, such as clear juice or decaffeinated sports drinks, as tolerated or as told by your health care provider.  Avoid drinks with caffeine, such as coffee, tea, or soft drinks.  Avoid alcohol. What foods are recommended? The items listed may not be a complete list. Talk with your health care provider about what dietary choices are best for you. Grains White rice. White, [place], or pita breads (fresh or toasted), including plain rolls, buns, or bagels. White pasta. Saltine, soda, or graham crackers. Pretzels. Low-fiber cereal. Cooked cereals made with water (such as cornmeal, farina, or cream cereals). Plain muffins. Matzo. Melba toast. Zwieback. Vegetables Potatoes (without the skin). Most well-cooked and canned vegetables without skins or seeds. Tender lettuce. Fruits Apple sauce. Fruits canned in juice. Cooked apricots, cherries, grapefruit, peaches, pears, or plums. Fresh bananas and cantaloupe. Meats and other protein foods Baked or boiled chicken. Eggs. Tofu. Fish. Seafood. Smooth nut butters. Ground or well-cooked tender beef, ham, veal, lamb, pork, or poultry. Dairy Plain yogurt, kefir, and unsweetened liquid yogurt. Lactose-free milk, buttermilk, skim milk, or soy milk. Low-fat or nonfat hard cheese. Beverages Water. Low-calorie sports drinks. Fruit juices without pulp. Strained tomato and vegetable juices. Decaffeinated teas. Sugar-free beverages not sweetened with sugar alcohols. Oral rehydration solutions, if approved by your health care provider. Seasoning and other foods Bouillon, broth, or soups made from recommended foods. What foods are not  recommended? The items listed may not be a complete list. Talk with your health care provider about what dietary choices are best for  you. Grains Whole grain, whole wheat, bran, or rye breads, rolls, pastas, and crackers. Wild or brown rice. Whole grain or bran cereals. Barley. Oats and oatmeal. Corn tortillas or taco shells. Granola. Popcorn. Vegetables Raw vegetables. Fried vegetables. Cabbage, broccoli, Brussels sprouts, artichokes, baked beans, beet greens, corn, kale, legumes, peas, sweet potatoes, and yams. Potato skins. Cooked spinach and cabbage. Fruits Dried fruit, including raisins and dates. Raw fruits. Stewed or dried prunes. Canned fruits with syrup. Meat and other protein foods Fried or fatty meats. Deli meats. Chunky nut butters. Nuts and seeds. Beans and lentils. Berniece Salines. Hot dogs. Sausage. Dairy High-fat cheeses. Whole milk, chocolate milk, and beverages made with milk, such as milk shakes. Half-and-half. Cream. sour cream. Ice cream. Beverages Caffeinated beverages (such as coffee, tea, soda, or energy drinks). Alcoholic beverages. Fruit juices with pulp. Prune juice. Soft drinks sweetened with high-fructose corn syrup or sugar alcohols. High-calorie sports drinks. Fats and oils Butter. Cream sauces. Margarine. Salad oils. Plain salad dressings. Olives. Avocados. Mayonnaise. Sweets and desserts Sweet rolls, doughnuts, and sweet breads. Sugar-free desserts sweetened with sugar alcohols such as xylitol and sorbitol. Seasoning and other foods Honey. Hot sauce. Chili powder. Gravy. Cream-based or milk-based soups. Pancakes and waffles. Summary  When you have diarrhea, the foods you eat and your eating habits are very important.  Make sure you get at least 8-10 cups of fluid each day, or enough to keep your urine clear or pale yellow.  Eat bland foods and gradually reintroduce healthy, nutrient-rich foods as tolerated, or as told by your health care provider.  Avoid high-fiber, fried, greasy, or spicy foods. This information is not intended to replace advice given to you by your health care provider. Make  sure you discuss any questions you have with your health care provider. Document Released: 06/30/2003 Document Revised: 04/06/2016 Document Reviewed: 04/06/2016 Elsevier Interactive Patient Education  2017 [employer].

## 2017-01-31 LAB — CELIAC DISEASE AB SCREEN W/RFX
Antigliadin Abs, IgA: 6 units (ref 0–19)
IGA/IMMUNOGLOBULIN A, SERUM: 263 mg/dL (ref 64–422)

## 2017-01-31 NOTE — Assessment & Plan Note (Signed)
Acute on chronic. Review of patient's chart, it appears that she had evaluations for prior diarrhea with very much similar symptoms. Reassured as she is afebrile, no weight loss and No abdominal pain. Pending stool cultures, labs. Will follow.

## 2017-01-31 NOTE — Assessment & Plan Note (Signed)
Cognition stable. However patient and family primaryily concerned for worsening anxiety which causes her to be more "confused". We discussed a starting an SSRI. patient recently advised to stop taking namenda however has stayed on medications. Messaged r Jaynee Eagles about suggestions for anxiolytic for patient. Will let patient know when I hear back.  Note: pending urine studies; patient without symptoms.

## 2017-01-31 NOTE — Telephone Encounter (Signed)
Pt's daughter Mardene Celeste  requested a call [phone]

## 2017-01-31 NOTE — Telephone Encounter (Signed)
Patients want to see if margaret will call her asap for patients anxiety meds  [phone]

## 2017-02-01 ENCOUNTER — Telehealth (HOSPITAL_COMMUNITY): Payer: Self-pay | Admitting: Family

## 2017-02-01 LAB — CULTURE, URINE COMPREHENSIVE
MICRO NUMBER:: 81129462
SPECIMEN QUALITY: ADEQUATE

## 2017-02-01 NOTE — Telephone Encounter (Signed)
Spoke with Maria Bass today and we agreed she would stop namenda as advised with Jaynee Eagles to see if will help with anxiety.  If this doesn't help, we will consider zoloft She rarely takes remeron- only prn for insomnia.   Patient will bring stool samples next week.

## 2017-02-04 ENCOUNTER — Encounter: Payer: Self-pay | Admitting: Family

## 2017-02-04 DIAGNOSIS — R159 Full incontinence of feces: Secondary | ICD-10-CM | POA: Diagnosis not present

## 2017-02-04 NOTE — Addendum Note (Signed)
Addended by: Leeanne Rio on: 02/04/2017 09:59 AM   Modules accepted: Orders

## 2017-02-05 ENCOUNTER — Other Ambulatory Visit (INDEPENDENT_AMBULATORY_CARE_PROVIDER_SITE_OTHER): Payer: Medicare Other

## 2017-02-05 DIAGNOSIS — R159 Full incontinence of feces: Secondary | ICD-10-CM | POA: Diagnosis not present

## 2017-02-05 LAB — FECAL OCCULT BLOOD, IMMUNOCHEMICAL: Fecal Occult Bld: NEGATIVE

## 2017-02-06 ENCOUNTER — Other Ambulatory Visit: Payer: Self-pay | Admitting: Family

## 2017-02-06 ENCOUNTER — Ambulatory Visit (INDEPENDENT_AMBULATORY_CARE_PROVIDER_SITE_OTHER): Payer: Medicare Other

## 2017-02-06 ENCOUNTER — Encounter: Payer: Self-pay | Admitting: Family

## 2017-02-06 VITALS — BP 110/70 | HR 76 | Temp 98.1°F | Resp 14 | Ht 62.0 in | Wt 112.8 lb

## 2017-02-06 DIAGNOSIS — Z0000 Encounter for general adult medical examination without abnormal findings: Secondary | ICD-10-CM

## 2017-02-06 DIAGNOSIS — K580 Irritable bowel syndrome with diarrhea: Secondary | ICD-10-CM

## 2017-02-06 MED ORDER — HYOSCYAMINE SULFATE 0.125 MG SL SUBL: 0.1250 mg | SUBLINGUAL_TABLET | SUBLINGUAL | 3 refills | Status: DC | PRN

## 2017-02-06 NOTE — Patient Instructions (Addendum)
  Ms. Askin , Thank you for taking time to come for your Medicare Wellness Visit. I appreciate your ongoing commitment to your health goals. Please review the following plan we discussed and let me know if I can assist you in the future.   Follow up with Mable Paris, [name]  Bring a copy of your [place] and/or Living Will to be scanned into chart.  Have a great day!  These are the goals we discussed: Goals    . Increase physical activity          Chair and standing exercises while standing.  Hold onto the kitchen sink for balance.       This is a list of the screening recommended for you and due dates:  Health Maintenance  Topic Date Due  . Tetanus Vaccine  10/16/2020  . Flu Shot  Completed  . DEXA scan (bone density measurement)  Completed  . Pneumonia vaccines  Completed

## 2017-02-06 NOTE — Progress Notes (Signed)
Subjective:   Maria Bass is a 83 y.o. female who presents for Medicare Annual (Subsequent) preventive examination.  Review of Systems:  No ROS.  Medicare Wellness Visit. Additional risk factors are reflected in the social history.  Cardiac Risk Factors include: advanced age (>35men, >72 women);hypertension     Objective:     Vitals: BP 110/70 (BP Location: Left Arm, Patient Position: Sitting, Cuff Size: Normal)   Pulse 76   Temp 98.1 F (36.7 C) (Oral)   Resp 14   Ht 5\' 2"  (1.575 m)   Wt 112 lb 12.8 oz (51.2 kg)   SpO2 98%   BMI 20.63 kg/m   Body mass index is 20.63 kg/m.   Tobacco History  Smoking Status  . Former Smoker  . Types: Cigarettes  . Quit date: 04/23/1978  Smokeless Tobacco  . Never Used    Comment: social smoker x 15     Counseling given: Not Answered   Past Medical History:  Diagnosis Date  . Anxiety   . Depression   . Hyperlipidemia   . Hypertension   . IBS (irritable bowel syndrome)    per pt- not dx as IBS, but has IBS "symptoms"  . MAI (mycobacterium avium-intracellulare) ([place]) 2003   treated at [facility]  . Skin cancer    Past Surgical History:  Procedure Laterality Date  . ABDOMINAL HYSTERECTOMY    . BLADDER REPAIR    . CATARACT EXTRACTION, BILATERAL    . TONSILLECTOMY     Family History  Problem Relation Age of Onset  . Heart attack Mother   . Heart attack Father   . Diabetes Father   . Colon cancer Neg Hx   . Esophageal cancer Neg Hx   . Stomach cancer Neg Hx    History  Sexual Activity  . Sexual activity: Not Currently    Outpatient Encounter Prescriptions as of 02/06/2017  Medication Sig  . Ascorbic Acid (VITAMIN C) 1000 MG tablet Take 1,000 mg by mouth daily.    [name] aspirin EC 325 MG tablet Take 325 mg by mouth daily.   [name] atorvastatin (LIPITOR) 40 MG tablet Take 1 tablet (40 mg total) by mouth daily.  [name] donepezil (ARICEPT) 10 MG tablet Start with 1/2 pill for one week then increase to a whole pill daily  .  losartan (COZAAR) 50 MG tablet Take 1 tablet (50 mg total) by mouth daily.  . memantine (NAMENDA XR) 28 MG CP24 24 hr capsule Take 1 capsule (28 mg total) by mouth daily.  . mirtazapine (REMERON) 45 MG tablet Take 1 tablet (45 mg total) by mouth at bedtime.  . [DISCONTINUED] hyoscyamine (LEVSIN SL) 0.125 MG SL tablet Place 1 tablet (0.125 mg total) under the tongue every 4 (four) hours as needed for cramping.   No facility-administered encounter medications on file as of 02/06/2017.     Activities of Daily Living In your present state of health, do you have any difficulty performing the following activities: 02/06/2017  Hearing? N  Vision? N  Difficulty concentrating or making decisions? Y  Comment Dx of Alzheimer's  Walking or climbing stairs? Y  Comment Unsteady gait  Dressing or bathing? N  Doing errands, shopping? Y  Preparing Food and eating ? N  Using the Toilet? N  In the past six months, have you accidently leaked urine? N  Do you have problems with loss of bowel control? Y  Comment Followed by PCP   Managing your Medications? Y  Comment staff  assists  Managing your Finances? Y  Comment daughter assists  Housekeeping or managing your Housekeeping? Y  Comment Staff assists  Some recent data might be hidden    Patient Care Team: Burnard Hawthorne, [name] as PCP - General (Family Medicine) Elsie Stain, MD (Pulmonary Disease)    Assessment:    This is a routine wellness examination for Maria Bass. The goal of the wellness visit is to assist the patient how to close the gaps in care and create a preventative care plan for the patient.   The roster of all physicians providing medical care to patient is listed in the Snapshot section of the chart.  Osteoporosis risk reviewed.    Safety issues reviewed; Life alert, smoke and carbon monoxide detectors in the home. No firearms in the home.  Wears seatbelts when riding with others. Patient does wear sunscreen or  protective clothing when in direct sunlight. No violence in the home.  No new identified risk were noted. Lives at the [facility] in independent [facility].  Staff assists with medication management and housekeeping.  Daughter assists with financial management.  No issues with bathing, dressing, meal preparation, grooming, toileting.  She does not drive.   BMI- discussed the importance of a healthy diet, water intake and the benefits of aerobic exercise. Educational material provided.   24 hour diet recall: Breakfast: Bannana, toast and peanut butter Lunch: Soup Dinner: Sandwich  Daily fluid intake: 0 cups of caffeine, 4 cups of water  Dental- every 6 months.  Eye- Visual acuity not assessed per patient preference since they have regular follow up with the ophthalmologist.  Wears corrective lenses.  Sleep patterns- Sleeps 6-7 hours at night.  Wakes feeling rested.  Health maintenance gaps- closed.  Patient Concerns: IBS; deferred to PCP for follow up. Levsin sent to pharmacy.   Exercise Activities and Dietary recommendations Current Exercise Habits: The patient does not participate in regular exercise at present  Goals    . Increase physical activity          Chair and standing exercises while standing.  Hold onto the kitchen sink for balance.      Fall Risk Fall Risk  02/06/2017 10/29/2016 08/13/2016 06/11/2016 04/10/2016  Falls in the past year? No No No No No  Risk for fall due to : Impaired balance/gait - - - -   Depression Screen PHQ 2/9 Scores 02/06/2017 10/29/2016 08/13/2016 06/11/2016  PHQ - 2 Score 1 0 0 0     Cognitive Function MMSE - Mini Mental State Exam 02/06/2017 11/27/2016 08/30/2016 08/30/2015  Not completed: Unable to complete - - Refused  Orientation to time - 4 5 -  Orientation to Place - 3 2 -  Registration - 3 3 -  Attention/ Calculation - 3 3 -  Recall - 2 2 -  Language- name 2 objects - 2 2 -  Language- repeat - 1 0 -  Language- follow 3 step command - 3 3  -  Language- read & follow direction - 1 1 -  Write a sentence - 0 1 -  Copy design - 1 0 -  Total score - 23 22 -        Immunization History  Administered Date(s) Administered  . Influenza Whole 01/22/2011, 02/27/2012, 02/09/2013  . Influenza, High Dose Seasonal PF 01/10/2016  . Influenza-Unspecified 01/19/2014, 01/07/2017  . Pneumococcal Conjugate-13 02/18/2014  . Pneumococcal Polysaccharide-23 04/02/2011  . Tdap 10/17/2010  . Zoster 08/29/2006   Screening Tests Health Maintenance  Topic Date Due  . TETANUS/TDAP  10/16/2020  . INFLUENZA VACCINE  Completed  . DEXA SCAN  Completed  . PNA vac Low Risk Adult  Completed      Plan:    End of life planning; Advance aging; Advanced directives discussed. Copy of current HCPOA/Living Will requested.    I have personally reviewed and noted the following in the patient's chart:   . Medical and social history . Use of alcohol, tobacco or illicit drugs  . Current medications and supplements . Functional ability and status . Nutritional status . Physical activity . Advanced directives . List of other physicians . Hospitalizations, surgeries, and ER visits in previous 12 months . Vitals . Screenings to include cognitive, depression, and falls . Referrals and appointments  In addition, I have reviewed and discussed with patient certain preventive protocols, quality metrics, and best practice recommendations. A written personalized care plan for preventive services as well as general preventive health recommendations were provided to patient.     Varney Biles, LPN  29/[id number]   Agree with plan. Mable Paris, NP

## 2017-02-08 ENCOUNTER — Encounter: Payer: Self-pay | Admitting: Family

## 2017-02-08 NOTE — Telephone Encounter (Signed)
Pt called asking what the next step is for when she has another episode. Please advise, thank you!

## 2017-02-11 ENCOUNTER — Telehealth: Payer: Self-pay | Admitting: Internal Medicine

## 2017-02-11 LAB — GASTROINTESTINAL PATHOGEN PANEL PCR
C. difficile Tox A/B, PCR: NOT DETECTED
CAMPYLOBACTER, PCR: NOT DETECTED
Cryptosporidium, PCR: NOT DETECTED
E COLI (STEC) STX1/STX2, PCR: NOT DETECTED
E coli (ETEC) LT/ST PCR: NOT DETECTED
E coli 0157, PCR: NOT DETECTED
Giardia lamblia, PCR: NOT DETECTED
NOROVIRUS, PCR: NOT DETECTED
ROTAVIRUS, PCR: NOT DETECTED
SALMONELLA, PCR: NOT DETECTED
SHIGELLA, PCR: NOT DETECTED

## 2017-02-11 LAB — C. DIFFICILE GDH AND TOXIN A/B
GDH ANTIGEN: NOT DETECTED
MICRO NUMBER:: 81146371
SPECIMEN QUALITY:: ADEQUATE
TOXIN A AND B: NOT DETECTED

## 2017-02-11 NOTE — Telephone Encounter (Signed)
She is an 83 year old lady with diarrhea seen by you 2013. C-diff is negative. GI path panel is pending. Would you accommodate her on your schedule?

## 2017-02-11 NOTE — Telephone Encounter (Signed)
Patient's daughter notified of the recommendations She will come in and see Nicoletta Ba PA 02/21/17

## 2017-02-11 NOTE — Telephone Encounter (Signed)
There are no APP openings this week.

## 2017-02-11 NOTE — Telephone Encounter (Signed)
Would recommend visit with APP, the note will come to me and I will participate in her care and treatment decisions I am overbooked for clinic this week already, so APP visit recommended I am sorry I can not accommodate her in my personal clinic this week

## 2017-02-11 NOTE — Telephone Encounter (Signed)
Can you accommodate her on your schedule?

## 2017-02-12 ENCOUNTER — Encounter: Payer: Self-pay | Admitting: Family

## 2017-02-13 ENCOUNTER — Other Ambulatory Visit: Payer: Self-pay | Admitting: Family

## 2017-02-13 DIAGNOSIS — F411 Generalized anxiety disorder: Secondary | ICD-10-CM | POA: Insufficient documentation

## 2017-02-13 MED ORDER — SERTRALINE HCL 50 MG PO TABS: 50.0000 mg | ORAL_TABLET | Freq: Every day | ORAL | 0 refills | Status: DC

## 2017-02-15 ENCOUNTER — Telehealth: Payer: Self-pay | Admitting: Family

## 2017-02-15 NOTE — Telephone Encounter (Signed)
Referral was faxed to [facility] for pt to have PT. They will need a amended note stating weakness. I did fax over note from last visit they wanted that, but also needs a amended note just incase. Thank you!

## 2017-02-18 DIAGNOSIS — G309 Alzheimer's disease, unspecified: Secondary | ICD-10-CM | POA: Diagnosis not present

## 2017-02-18 DIAGNOSIS — R531 Weakness: Secondary | ICD-10-CM | POA: Diagnosis not present

## 2017-02-18 DIAGNOSIS — R269 Unspecified abnormalities of gait and mobility: Secondary | ICD-10-CM | POA: Diagnosis not present

## 2017-02-18 DIAGNOSIS — F411 Generalized anxiety disorder: Secondary | ICD-10-CM | POA: Diagnosis not present

## 2017-02-18 DIAGNOSIS — K580 Irritable bowel syndrome with diarrhea: Secondary | ICD-10-CM | POA: Diagnosis not present

## 2017-02-18 DIAGNOSIS — F329 Major depressive disorder, single episode, unspecified: Secondary | ICD-10-CM | POA: Diagnosis not present

## 2017-02-18 DIAGNOSIS — Z87891 Personal history of nicotine dependence: Secondary | ICD-10-CM | POA: Diagnosis not present

## 2017-02-18 DIAGNOSIS — F0280 Dementia in other diseases classified elsewhere without behavioral disturbance: Secondary | ICD-10-CM | POA: Diagnosis not present

## 2017-02-18 DIAGNOSIS — I10 Essential (primary) hypertension: Secondary | ICD-10-CM | POA: Diagnosis not present

## 2017-02-18 NOTE — Telephone Encounter (Signed)
Ok. Np. They did not call back yet :)

## 2017-02-18 NOTE — Telephone Encounter (Signed)
I do not want to amend my note unless we need too. If they need more documentation , let me know and Fransisco Beau can write letter to that effect

## 2017-02-21 ENCOUNTER — Encounter: Payer: Self-pay | Admitting: Physician Assistant

## 2017-02-21 ENCOUNTER — Ambulatory Visit (INDEPENDENT_AMBULATORY_CARE_PROVIDER_SITE_OTHER): Payer: Medicare Other | Admitting: Physician Assistant

## 2017-02-21 VITALS — BP 100/60 | HR 61 | Ht 62.0 in | Wt 111.0 lb

## 2017-02-21 DIAGNOSIS — K580 Irritable bowel syndrome with diarrhea: Secondary | ICD-10-CM | POA: Diagnosis not present

## 2017-02-21 MED ORDER — DICYCLOMINE HCL 10 MG PO CAPS: ORAL_CAPSULE | ORAL | 3 refills | Status: DC

## 2017-02-21 NOTE — Patient Instructions (Signed)
We have sent the following medications to your pharmacy for you to pick up at your convenience: [facility], [place], [place] 1. Bentyl ( dicyclomine) 10 mg.  We have given you samples of Xifaxan 550 mg. Tale 1 tablet 3 times daily with food until gone.  Call us back in 2 weeks and make an appointment to see Nicoletta Ba PA first or second week of December.

## 2017-02-21 NOTE — Progress Notes (Addendum)
Subjective:    Patient ID: Maria Bass, female    DOB: 10-07-1929, 83 y.o.   MRN: [id number]  HPI Maria Bass is a pleasant 83 year old white female, known to Dr. Hilarie Bass who was last seen in our office in 2013. She was referred today by Maria Paris NP /primary care . She has history of IBS- D, Alzheimer's dementia, hypertension, aortic stenosis, and generalized anxiety. She comes in today with complaints of ongoing issues with intermittent diarrhea with progressive symptoms over the past 3-4 months. Last colonoscopy was done in October 2013 showing moderate diverticulosis with significant tortuosity, she had to 3-5 mm sessile polyps removed from the rectum, both were hyperplastic. Random biopsies were also done and negative. Patient says she went for several years without any significant difficulty with urgency and diarrhea in 3-4 months ago starting having episodes of what she describes as explosion. She says she has very forceful gas and liquid stool which make him on urgently and has occurred at night with incontinence. These episodes are very sporadic, on most days however her stools are loose and she says she hardly ever has a normal bowel movement. Eyes any difficulty with abdominal pain or cramping, no bloating but does have a lot of gas and flatus. Appetite has been fine and weight has been stable. She's not been on any recent antibiotics. No complaints of melena or hematochezia.  Recent Hemoccult, GI pathogen panel, stool for C. difficile, and TTG per her PCP were all negative.      Review of Systems  Pertinent positive and negative review of systems were noted in the above HPI section.  All other review of systems was otherwise negative.  Outpatient Encounter Prescriptions as of 02/21/2017  Medication Sig  . aspirin EC 325 MG tablet Take 325 mg by mouth daily.   [name] atorvastatin (LIPITOR) 40 MG tablet Take 1 tablet (40 mg total) by mouth daily.  [name] donepezil (ARICEPT) 10 MG tablet  Start with 1/2 pill for one week then increase to a whole pill daily  . fexofenadine (ALLEGRA) 180 MG tablet Take 180 mg by mouth daily.  . hyoscyamine (LEVSIN SL) 0.125 MG SL tablet Place 1 tablet (0.125 mg total) under the tongue every 4 (four) hours as needed for cramping.  [name] losartan (COZAAR) 50 MG tablet Take 1 tablet (50 mg total) by mouth daily.  . sertraline (ZOLOFT) 50 MG tablet Take 1 tablet (50 mg total) by mouth at bedtime.  . dicyclomine (BENTYL) 10 MG capsule Take 1 tab twice daily .  . [DISCONTINUED] Ascorbic Acid (VITAMIN C) 1000 MG tablet Take 1,000 mg by mouth daily.    . [DISCONTINUED] dicyclomine (BENTYL) 10 MG capsule Take 1 tab twice daily .  . [DISCONTINUED] memantine (NAMENDA XR) 28 MG CP24 24 hr capsule Take 1 capsule (28 mg total) by mouth daily.   No facility-administered encounter medications on file as of 02/21/2017.    No Known Allergies Patient Active Problem List   Diagnosis Date Noted  . GAD (generalized anxiety disorder) 02/13/2017  . Neuropathy 10/29/2016  . Insomnia 10/29/2016  . Atherosclerosis of aorta ([place]) 09/25/2016  . Abnormal chest x-ray with multiple lung nodules 07/09/2016  . Routine general medical examination at a health care facility 09/30/2015  . Aortic valve stenosis 07/30/2014  . Palpitations 07/13/2014  . Esophageal reflux 02/18/2014  . Stool incontinence 02/18/2014  . Alzheimer's dementia 10/27/2012  . Allergic rhinitis 11/28/2011  . Medicare annual wellness visit, subsequent 10/30/2011  . Female bladder  prolapse 10/30/2011  . Irritable bowel syndrome with diarrhea 10/30/2011  . Cardiac murmur 10/30/2011  . Heart murmur 05/04/2011  . Bronchiectasis ([place]) 02/20/2011  . Hypertension 12/26/2010  . Hyperlipidemia 12/26/2010  . Depression 12/26/2010   Social History   Social History  . Marital status: Married    Spouse name: N/A  . Number of children: 4  . Years of education: 52   Occupational History  . Retired Retried    Social History Main Topics  . Smoking status: Former Smoker    Types: Cigarettes    Quit date: 04/23/1978  . Smokeless tobacco: Never Used     Comment: social smoker x 15  . Alcohol use 4.2 oz/week    7 Glasses of wine per week     Comment: glass a wine at night  . Drug use: No  . Sexual activity: Not Currently   Other Topics Concern  . Not on file   Social History Narrative   Lives in [place], [place]   Widow   Right-handed   Caffeine: 2 cups coffe in the a.m.       [name]'s family history includes Diabetes in her father; Heart attack in her father and mother.      Objective:    Vitals:   02/21/17 1034  BP: 100/60  Pulse: 61    Physical Exam  ; well-developed elderly white female in no acute distress, pleasant blood pressure 100/60, pulse 61, BMI 20.3. HEENT ;nontraumatic normocephalic EOMI PERRLA sclera anicteric, Cardiovascular ;regular rate and rhythm with S1-S2, Pulmonary ;clear bilaterally, Abdomen; soft, nontender nondistended bowel sounds are active there is no palpable mass or hepatosplenomegaly, Rectal ;exam not done, Ext; no clubbing cyanosis or edema skin warm and dry, Neuropsych ;mood and affect appropriate       Assessment & Plan:   #17 83 year old white female with history of IBS, diarrhea predominant with exacerbation of symptoms over the past 3-4 months. She's having loose stools on a regular basis and intermittent episodes of urgency, flatus and liquid stool. #2 diverticulosis #3 history of hyperplastic polyps #4 Alzheimer's dementia #5 aortic stenosis #6 hypertension  Plan;  Start a course of Xifaxan 550 mg by mouth 3 times daily 14 days. Samples were provided Bentyl 10 mg by mouth twice daily Will plan to see patient back in follow-up in 4-6 weeks.  Maria S Esterwood PA-C 02/21/2017   Cc: Maria Hawthorne, [name]  Addendum: Reviewed and agree with initial management. Maria Bass, Maria Lines, MD

## 2017-02-22 ENCOUNTER — Telehealth: Payer: Self-pay | Admitting: Physician Assistant

## 2017-02-22 DIAGNOSIS — F0280 Dementia in other diseases classified elsewhere without behavioral disturbance: Secondary | ICD-10-CM | POA: Diagnosis not present

## 2017-02-22 DIAGNOSIS — G309 Alzheimer's disease, unspecified: Secondary | ICD-10-CM | POA: Diagnosis not present

## 2017-02-22 DIAGNOSIS — R269 Unspecified abnormalities of gait and mobility: Secondary | ICD-10-CM | POA: Diagnosis not present

## 2017-02-22 DIAGNOSIS — R531 Weakness: Secondary | ICD-10-CM | POA: Diagnosis not present

## 2017-02-22 DIAGNOSIS — K580 Irritable bowel syndrome with diarrhea: Secondary | ICD-10-CM | POA: Diagnosis not present

## 2017-02-22 DIAGNOSIS — F411 Generalized anxiety disorder: Secondary | ICD-10-CM | POA: Diagnosis not present

## 2017-02-25 ENCOUNTER — Telehealth: Payer: Self-pay

## 2017-02-25 DIAGNOSIS — F0280 Dementia in other diseases classified elsewhere without behavioral disturbance: Secondary | ICD-10-CM | POA: Diagnosis not present

## 2017-02-25 DIAGNOSIS — R269 Unspecified abnormalities of gait and mobility: Secondary | ICD-10-CM | POA: Diagnosis not present

## 2017-02-25 DIAGNOSIS — G309 Alzheimer's disease, unspecified: Secondary | ICD-10-CM | POA: Diagnosis not present

## 2017-02-25 DIAGNOSIS — K580 Irritable bowel syndrome with diarrhea: Secondary | ICD-10-CM | POA: Diagnosis not present

## 2017-02-25 DIAGNOSIS — R531 Weakness: Secondary | ICD-10-CM | POA: Diagnosis not present

## 2017-02-25 DIAGNOSIS — F411 Generalized anxiety disorder: Secondary | ICD-10-CM | POA: Diagnosis not present

## 2017-02-25 NOTE — Telephone Encounter (Signed)
Copied from Kathryn [phone]. Topic: Quick Communication - See Telephone Encounter >> Feb 25, 2017 12:51 PM Corie Chiquito, [place] wrote: CRM for notification. See Telephone encounter for:   02/25/17.Rose Fillers that is a home health therapist called because she needs a verbal order for a speech evaluation for the patient due to confusion and she can be reached at [phone]. If someone could give her a call back please.

## 2017-02-25 NOTE — Telephone Encounter (Signed)
Left a message for her to call back ands ask for Maria Bass.

## 2017-02-25 NOTE — Telephone Encounter (Signed)
Please call pt's daughter about meds

## 2017-02-25 NOTE — Telephone Encounter (Signed)
Ok to do so

## 2017-02-26 NOTE — Telephone Encounter (Signed)
Yes that is okay Please get more detail on speech and confusion. The speech in particular is a new symptom to me. Is her neurologist, Dr Jaynee Eagles aware?

## 2017-02-26 NOTE — Telephone Encounter (Signed)
Maria Bass has been informed the verbal order. Also the referral is for the confusion not for speech.

## 2017-02-28 DIAGNOSIS — G309 Alzheimer's disease, unspecified: Secondary | ICD-10-CM | POA: Diagnosis not present

## 2017-02-28 DIAGNOSIS — F411 Generalized anxiety disorder: Secondary | ICD-10-CM | POA: Diagnosis not present

## 2017-02-28 DIAGNOSIS — R531 Weakness: Secondary | ICD-10-CM | POA: Diagnosis not present

## 2017-02-28 DIAGNOSIS — R269 Unspecified abnormalities of gait and mobility: Secondary | ICD-10-CM | POA: Diagnosis not present

## 2017-02-28 DIAGNOSIS — K580 Irritable bowel syndrome with diarrhea: Secondary | ICD-10-CM | POA: Diagnosis not present

## 2017-02-28 DIAGNOSIS — F0280 Dementia in other diseases classified elsewhere without behavioral disturbance: Secondary | ICD-10-CM | POA: Diagnosis not present

## 2017-03-04 DIAGNOSIS — G309 Alzheimer's disease, unspecified: Secondary | ICD-10-CM | POA: Diagnosis not present

## 2017-03-04 DIAGNOSIS — R531 Weakness: Secondary | ICD-10-CM | POA: Diagnosis not present

## 2017-03-04 DIAGNOSIS — R269 Unspecified abnormalities of gait and mobility: Secondary | ICD-10-CM | POA: Diagnosis not present

## 2017-03-04 DIAGNOSIS — F0280 Dementia in other diseases classified elsewhere without behavioral disturbance: Secondary | ICD-10-CM | POA: Diagnosis not present

## 2017-03-04 DIAGNOSIS — K580 Irritable bowel syndrome with diarrhea: Secondary | ICD-10-CM | POA: Diagnosis not present

## 2017-03-04 DIAGNOSIS — F411 Generalized anxiety disorder: Secondary | ICD-10-CM | POA: Diagnosis not present

## 2017-03-06 NOTE — Telephone Encounter (Signed)
Spoke with the daughter. She was calling to schedule the follow up appointment.

## 2017-04-02 ENCOUNTER — Ambulatory Visit: Payer: Private Health Insurance - Indemnity | Admitting: Physician Assistant

## 2017-04-12 ENCOUNTER — Ambulatory Visit (INDEPENDENT_AMBULATORY_CARE_PROVIDER_SITE_OTHER): Payer: Medicare Other | Admitting: Physician Assistant

## 2017-04-12 ENCOUNTER — Encounter: Payer: Self-pay | Admitting: Physician Assistant

## 2017-04-12 VITALS — BP 116/60 | HR 60 | Ht 60.5 in | Wt 107.4 lb

## 2017-04-12 DIAGNOSIS — R159 Full incontinence of feces: Secondary | ICD-10-CM | POA: Diagnosis not present

## 2017-04-12 DIAGNOSIS — K580 Irritable bowel syndrome with diarrhea: Secondary | ICD-10-CM | POA: Diagnosis not present

## 2017-04-12 MED ORDER — CHOLESTYRAMINE 4 G PO PACK: PACK | ORAL | 12 refills | Status: DC

## 2017-04-12 NOTE — Patient Instructions (Signed)
Take Questran pack in juice or water at lunchtime daily .  Take Imodium 1 tablet every morning daily.   Stop the Bentyl ( Dicyclomine).

## 2017-04-12 NOTE — Progress Notes (Addendum)
Subjective:    Patient ID: Maria Bass, female    DOB: 15-Mar-1930, 83 y.o.   MRN: [id number]  HPI Maria Bass is an 83 year old white female, known to Dr. Hilarie Bass with history of IBS and chronic loose stool who comes in today for follow-up.  She also has history of hypertension, bronchiectasis, GERD, Alzheimer's dementia and diverticulosis. She was last seen in the office in November 2018 by myself at that time with complaints of ongoing difficulty with diarrhea with urgency and occasional episodes of at least partial incontinence.  At that time she said the episodes were very sporadic but that her stools were always very loose.  He has not had any melena or hematochezia.  We gave her a trial of Xifaxan for 14 days but neither the patient nor the daughter feel that this made much difference.  She has been using Bentyl 10 mg p.o. twice daily again without any significant change in symptoms. Patient gives a very difficult history because of her Alzheimer's but it sounds as if she continues to havefrom 1-3 bowel movements per day.  I think what is distressing her most is that she has episodes of partial incontinence or fecal soilage on a regular basis and is stressed distressed because she may have to change her clothes a couple of times per day.  She denies any abdominal pain and has not been having any problems with cramping recently. Last colonoscopy done in 2013 showed moderate diverticulosis with significant tortuosity he had some 3-5 mm sessile polyps removed which were hyperplastic and random biopsies were negative for microscopic colitis.  You know you I do not you all my days  Review of Systems Pertinent positive and negative review of systems were noted in the above HPI section.  All other review of systems was otherwise negative.  Outpatient Encounter Medications as of 04/12/2017  Medication Sig  . aspirin EC 325 MG tablet Take 325 mg by mouth daily.   [name] atorvastatin (LIPITOR) 40 MG tablet  Take 1 tablet (40 mg total) by mouth daily.  [name] dicyclomine (BENTYL) 10 MG capsule Take 1 tab twice daily .  . donepezil (ARICEPT) 10 MG tablet Start with 1/2 pill for one week then increase to a whole pill daily  . fexofenadine (ALLEGRA) 180 MG tablet Take 180 mg by mouth daily.  . hyoscyamine (LEVSIN SL) 0.125 MG SL tablet Place 1 tablet (0.125 mg total) under the tongue every 4 (four) hours as needed for cramping.  [name] losartan (COZAAR) 50 MG tablet Take 1 tablet (50 mg total) by mouth daily.  . sertraline (ZOLOFT) 50 MG tablet Take 1 tablet (50 mg total) by mouth at bedtime.  . cholestyramine (QUESTRAN) 4 g packet Take 4 grams in juice or water once daily. Take at lunch time. ( take 2 hours away from other medications. )   No facility-administered encounter medications on file as of 04/12/2017.    No Known Allergies Patient Active Problem List   Diagnosis Date Noted  . GAD (generalized anxiety disorder) 02/13/2017  . Neuropathy 10/29/2016  . Insomnia 10/29/2016  . Atherosclerosis of aorta ([place]) 09/25/2016  . Abnormal chest x-ray with multiple lung nodules 07/09/2016  . Routine general medical examination at a health care facility 09/30/2015  . Aortic valve stenosis 07/30/2014  . Palpitations 07/13/2014  . Esophageal reflux 02/18/2014  . Stool incontinence 02/18/2014  . Alzheimer's dementia 10/27/2012  . Allergic rhinitis 11/28/2011  . Medicare annual wellness visit, subsequent 10/30/2011  . Female bladder  prolapse 10/30/2011  . Irritable bowel syndrome with diarrhea 10/30/2011  . Cardiac murmur 10/30/2011  . Heart murmur 05/04/2011  . Bronchiectasis ([place]) 02/20/2011  . Hypertension 12/26/2010  . Hyperlipidemia 12/26/2010  . Depression 12/26/2010   Social History   Socioeconomic History  . Marital status: Married    Spouse name: Not on file  . Number of children: 4  . Years of education: 48  . Highest education level: Not on file  Social Needs  . Financial resource  strain: Not on file  . Food insecurity - worry: Not on file  . Food insecurity - inability: Not on file  . Transportation needs - medical: Not on file  . Transportation needs - non-medical: Not on file  Occupational History  . Occupation: Retired    Fish farm manager: retried  Tobacco Use  . Smoking status: Former Smoker    Types: Cigarettes    Last attempt to quit: 04/23/1978    Years since quitting: 38.9  . Smokeless tobacco: Never Used  . Tobacco comment: social smoker x 15  Substance and Sexual Activity  . Alcohol use: Yes    Alcohol/week: 4.2 oz    Types: 7 Glasses of wine per week    Comment: glass a wine at night  . Drug use: No  . Sexual activity: Not Currently  Other Topics Concern  . Not on file  Social History Narrative   Lives in [place], [place]   Widow   Right-handed   Caffeine: 2 cups coffe in the a.m.    Ms. Kreamer's family history includes Diabetes in her father; Heart attack in her father and mother.      Objective:    Vitals:   04/12/17 1101  BP: 116/60  Pulse: 60    Physical Exam; well-developed elderly white female in no acute distress, accompanied by her daughter blood pressure 116/60 pulse 60, height 5 foot 0, weight 107, BMI 20.6.  HEENT; nontraumatic normocephalic EOMI PERRLA sclera anicteric, Cardiovascular; regular rate and rhythm with S1-S2 no murmur rub gallop, Pulmonary ;clear bilaterally, Abdomen; soft, nontender nondistended bowel sounds are active there is no mass or hepatosplenomegaly, Rectal; exam small amount of soft stool in the rectal vault brown, patient has a lax anal sphincter with minimal squeeze.  Extremities ;no clubbing cyanosis or edema skin warm dry, Neuro psych; mood and affect appropriate, she is a very difficult historian due to her Alzheimer's dementia.       Assessment & Plan:   #73 83 year old white female with Alzheimer's dementia with history of IBS/diarrhea predominant comes in with continued  complaints of loose stools 1-3 times daily and what sounds like very frequent episodes of fecal soilage or partial incontinence. No appreciable response to a course of Xifaxan, and no significant benefit with dicyclomine  #2 diverticulosis #3.  Bronchiectasis #4.  Hypertension  Plan; stop dicyclomine Start trial of Questran 4 g daily in juice or water to be taken at lunchtime which will be at least 2 hours away from any of our other medications Start Imodium 1 p.o. every morning We discussed regular use of depends which will certainly be easier for her to manage than multiple clothing changes per day. Will follow up with Dr. Hilarie Bass or myself as needed.  Amy Genia Harold PA-C 04/12/2017   Cc: Burnard Hawthorne, [name]   Addendum: Reviewed and agree with management. Pyrtle, Lajuan Lines, MD

## 2017-05-09 ENCOUNTER — Encounter: Payer: Self-pay | Admitting: Neurology

## 2017-05-09 DIAGNOSIS — Z23 Encounter for immunization: Secondary | ICD-10-CM | POA: Diagnosis not present
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
## 2017-05-30 ENCOUNTER — Encounter: Payer: Self-pay | Admitting: Nurse Practitioner

## 2017-05-30 ENCOUNTER — Ambulatory Visit (INDEPENDENT_AMBULATORY_CARE_PROVIDER_SITE_OTHER): Payer: Medicare Other | Admitting: Nurse Practitioner

## 2017-05-30 VITALS — BP 104/61 | HR 57 | Wt 104.8 lb

## 2017-05-30 DIAGNOSIS — F0281 Dementia in other diseases classified elsewhere with behavioral disturbance: Secondary | ICD-10-CM

## 2017-05-30 DIAGNOSIS — G309 Alzheimer's disease, unspecified: Secondary | ICD-10-CM | POA: Diagnosis not present

## 2017-05-30 NOTE — Patient Instructions (Signed)
Continue Aricept at current dose  Memory score is stable Participate in cognitive actitvities such as crosswords, word search etc.  F/U in 6 to 8 months

## 2017-05-31 ENCOUNTER — Other Ambulatory Visit: Payer: Self-pay | Admitting: Family

## 2017-05-31 DIAGNOSIS — F411 Generalized anxiety disorder: Secondary | ICD-10-CM

## 2017-05-31 NOTE — Telephone Encounter (Signed)
Please advise, Maria Bass on maternity leave.  Requesting a refill on sertraline.  LOV  01/30/17 NOV  06/19/17   [facility] [address] [place], [place]

## 2017-05-31 NOTE — Telephone Encounter (Signed)
Medication refilled

## 2017-05-31 NOTE — Telephone Encounter (Signed)
Copied from [place]. Topic: Quick Communication - Rx Refill/Question >> May 31, 2017 10:43 AM Yvette Rack wrote: Medication: sertraline (ZOLOFT) 50 MG tablet   Has the patient contacted their pharmacy? Yes.     (Agent: If no, request that the patient contact the pharmacy for the refill.)   Preferred Pharmacy (with phone number or street name): [facility], [place] - [place] [phone] (Phone) [phone] (Fax)     Agent: Please be advised that RX refills may take up to 3 business days. We ask that you follow-up with your pharmacy.

## 2017-06-16 ENCOUNTER — Encounter: Payer: Self-pay | Admitting: Family

## 2017-06-17 ENCOUNTER — Encounter: Payer: Self-pay | Admitting: Family

## 2017-06-19 ENCOUNTER — Ambulatory Visit: Payer: Private Health Insurance - Indemnity | Admitting: Family

## 2017-06-19 ENCOUNTER — Other Ambulatory Visit: Payer: Self-pay | Admitting: Family

## 2017-06-19 DIAGNOSIS — F411 Generalized anxiety disorder: Secondary | ICD-10-CM

## 2017-06-19 MED ORDER — SERTRALINE HCL 50 MG PO TABS: 100.0000 mg | ORAL_TABLET | Freq: Every day | ORAL | 3 refills | Status: DC

## 2017-07-09 ENCOUNTER — Encounter: Payer: Self-pay | Admitting: Family

## 2017-07-09 ENCOUNTER — Telehealth: Payer: Self-pay

## 2017-07-09 DIAGNOSIS — R197 Diarrhea, unspecified: Secondary | ICD-10-CM

## 2017-07-09 NOTE — Telephone Encounter (Signed)
Copied from [place]. Topic: Appointment Scheduling - Scheduling Inquiry for Clinic >> Jul 09, 2017  1:51 PM Scherrie Gerlach wrote: Reason for CRM: pt has 45 min restriction limits on her scheduling preferences. Pt needs to be seen for bowel issues and unable to find available time slot Can you schedule this pt?  Daughter states pt needs to be seen as soon as you can get her in.  Thank you  Daughter patricia would like a call back asap to schedule.

## 2017-07-10 ENCOUNTER — Telehealth: Payer: Self-pay

## 2017-07-10 NOTE — Telephone Encounter (Signed)
Copied from [place]. Topic: Appointment Scheduling - Scheduling Inquiry for Clinic >> Jul 09, 2017  1:51 PM Scherrie Gerlach wrote: Reason for CRM: pt has 45 min restriction limits on her scheduling preferences. Pt needs to be seen for bowel issues and unable to find available time slot Can you schedule this pt?  Daughter states pt needs to be seen as soon as you can get her in.  Thank you  Daughter patricia would like a call back asap to schedule. >> Jul 10, 2017 11:32 AM Ahmed Prima L wrote: Please call pt's daughter to squeeze her in on 4/3 at 9am. I do not have override to sch this. Spoke with Alwyn Ren at office and she said that was okay to use but would have [place] call the daughter [phone] patty

## 2017-07-10 NOTE — Telephone Encounter (Addendum)
The Diarrhea has been going on a while and is incontinent of bowel.. Patient has seen GI and has medication patient per daughter " has it set in her head she needs to [facility] and she will not stop" patient moving to assisted living next week. Daughter took an appointment with you for 07/24/17.

## 2017-07-10 NOTE — Telephone Encounter (Signed)
Maria Bass, what slots do I have?See mychart message that I sent  If no slots this week, she would need to see another provider for the diarrhea. Can we get more info regarding diarrhea. You may speak with daughters on file   I will go ahead and order stool culture , cards and daughters can pick this up.

## 2017-07-10 NOTE — Telephone Encounter (Signed)
Copied from [place]. Topic: Appointment Scheduling - Scheduling Inquiry for Clinic >> Jul 09, 2017  1:51 PM Scherrie Gerlach wrote: Reason for CRM: pt has 45 min restriction limits on her scheduling preferences. Pt needs to be seen for bowel issues and unable to find available time slot Can you schedule this pt?  Daughter states pt needs to be seen as soon as you can get her in.  Thank you  Daughter patricia would like a call back asap to schedule. >> Jul 10, 2017 11:32 AM Ahmed Prima L wrote: Please call pt's daughter to squeeze her in on 4/3 at 9am. I do not have override to sch this. Spoke with Alwyn Ren at office and she said that was okay to use but would have [place] call the daughter [phone] patty

## 2017-07-10 NOTE — Telephone Encounter (Signed)
Spoke with daughter Maria Bass Patient is having loose bowels and sometimes having accidents before getting to bathroom .   Patient is seeing GI.   Would like to be seen by you.  Patient has not been complaint in taking medication due to Alzheimer and caregiver comes in and gives medication .  Only opening you have is acute on 07/15/17.  Please advise.

## 2017-07-10 NOTE — Telephone Encounter (Signed)
Tried to reach daughter whom is also sick and trying to schedule an appointment she will all back she is on phone with [place] for herself.

## 2017-07-10 NOTE — Telephone Encounter (Signed)
Maria Bass are you handling message ? Let me know if I need to .

## 2017-07-10 NOTE — Addendum Note (Signed)
Addended by: Burnard Hawthorne on: 07/10/2017 10:03 AM   Modules accepted: Orders

## 2017-07-12 NOTE — Telephone Encounter (Signed)
Circle back with daughters,  They have done stool culture in past.   I would advise another to ensure no bacterial infection; prdered placed. Please ensure they drop off here

## 2017-07-15 NOTE — Telephone Encounter (Signed)
Left message to return call to office. [facility] may advise, of stool culture.

## 2017-07-15 NOTE — Telephone Encounter (Signed)
Per daughter Maria Bass, she stated that they will not be able to or will be difficult to get sample. Daughter wanted provider to know her mother's health has declined and is moving to assisted living. Pt has appt. 07/24/17 @ 0900.

## 2017-07-15 NOTE — Telephone Encounter (Signed)
fyi

## 2017-07-19 NOTE — Telephone Encounter (Signed)
See my telephone  message from 07/10/17

## 2017-07-24 ENCOUNTER — Ambulatory Visit: Payer: Private Health Insurance - Indemnity | Admitting: Family

## 2017-07-25 ENCOUNTER — Telehealth: Payer: Self-pay | Admitting: Family

## 2017-07-25 NOTE — Telephone Encounter (Signed)
Pt's daughter dropped off a FL2 to be completed. Paper is up front in color folder. Pt's daughter would like paper faxed and she will pick up a copy on Monday when she comes in for her appt.

## 2017-07-26 NOTE — Telephone Encounter (Signed)
Placed in your folder.

## 2017-07-29 NOTE — Telephone Encounter (Signed)
Form faxed to [facility]

## 2017-07-31 ENCOUNTER — Encounter
Admission: RE | Admit: 2017-07-31 | Discharge: 2017-07-31 | Disposition: A | Discharge status: Home / Self Care | Payer: Medicare Other | Source: Ambulatory Visit | Attending: Internal Medicine | Admitting: Internal Medicine

## 2017-08-06 NOTE — Progress Notes (Signed)
Personally  participated in, made any corrections needed, and agree with history, physical, neuro exam,assessment and plan as stated above.    Antonia Ahern, MD [facility] 

## 2017-08-07 ENCOUNTER — Non-Acute Institutional Stay: Payer: Medicare Other | Admitting: Gerontology

## 2017-08-07 DIAGNOSIS — I10 Essential (primary) hypertension: Secondary | ICD-10-CM | POA: Diagnosis not present

## 2017-08-07 DIAGNOSIS — L84 Corns and callosities: Secondary | ICD-10-CM

## 2017-08-07 DIAGNOSIS — I700 Atherosclerosis of aorta: Secondary | ICD-10-CM | POA: Diagnosis not present

## 2017-08-13 DIAGNOSIS — L84 Corns and callosities: Secondary | ICD-10-CM | POA: Insufficient documentation

## 2017-08-13 NOTE — Assessment & Plan Note (Signed)
Stable.  No complaints of chest pain or shortness of breath.  Currently on aspirin EC 325 mg p.o. daily and Lipitor 40 mg p.o. daily

## 2017-08-13 NOTE — Progress Notes (Signed)
Location:      Place of Service:  ALF (13) Provider:  Toni Arthurs, NP-C  [name], [name], [name]  Patient Care Team: Burnard Hawthorne, [name] as PCP - General (Family Medicine) Elsie Stain, MD (Pulmonary Disease)  Extended Emergency Contact Information Primary Emergency Contact: Bardin,Patricia Address: [address]          [place], [place] [address] Johnnette Litter of [place] Mobile Phone: [phone] Relation: Daughter  Code Status: Full Goals of care: Advanced Directive information Advanced Directives 02/06/2017  Does Patient Have a Medical Advance Directive? Yes  Type of Advance Directive Living will;Healthcare Power of Attorney  Does patient want to make changes to medical advance directive? No - Patient declined  Copy of [place] in Chart? No - copy requested     Chief Complaint  Patient presents with  . Acute Visit    sore toe  . Medical Management of Chronic Issues    HPI:  Pt is a 84 y.o. female seen today for medical management of chronic diseases.    Hypertension Stable.  No recent episodes of hyper or hypotension.  Pulse trending on the low side, so will monitor this.  Patient denies chest pain or shortness of breath.  Stable on Cozaar 50 mg p.o. daily  Atherosclerosis of aorta (HCC) Stable.  No complaints of chest pain or shortness of breath.  Currently on aspirin EC 325 mg p.o. daily and Lipitor 40 mg p.o. daily  Pre-ulcerative corn or callous Area on the lateral aspect of bilateral great toes.  Right worse than left.  Toes are almost to the point of hammertoes.  Callus formation on the lateral side of the great toes where the second toe constantly rubs and compresses the area.  Patient complains of the protruding callus being painful to walk on.  Patient and staff reports that she has been to multiple podiatrist, all saying there was nothing they could do for it.  Patient reports this is "in her family."  Area is tough, callused  but pliable tissue without ulceration currently.  No redness, warmth, bleeding or drainage.  Will attempt mild chemical exfoliation/erosion of the callused area with padding.  If this is ineffective, will consider sending to get another podiatry appointment to evaluate for the possibility of debulking for comfort.  Patient agreeable to plan.  Please note pt with limited verbal/cognitive ability. Unable to obtain complete ROS. Some ROS info obtained from staff and documentation.   Past Medical History:  Diagnosis Date  . Anxiety   . Depression   . Hyperlipidemia   . Hypertension   . IBS (irritable bowel syndrome)    per pt- not dx as IBS, but has IBS "symptoms"  . MAI (mycobacterium avium-intracellulare) ([place]) 2003   treated at [facility]  . Skin cancer    Past Surgical History:  Procedure Laterality Date  . ABDOMINAL HYSTERECTOMY    . BLADDER REPAIR    . CATARACT EXTRACTION, BILATERAL    . TONSILLECTOMY      No Known Allergies  Allergies as of 08/07/2017   No Known Allergies     Medication List        Accurate as of 08/07/17 11:59 PM. Always use your most recent med list.          aspirin EC 325 MG tablet Take 325 mg by mouth daily.   atorvastatin 40 MG tablet Commonly known as:  LIPITOR Take 1 tablet (40 mg total) by mouth daily.  cholestyramine 4 g packet Commonly known as:  QUESTRAN Take 4 grams in juice or water once daily. Take at lunch time. ( take 2 hours away from other medications. )   donepezil 10 MG tablet Commonly known as:  ARICEPT Start with 1/2 pill for one week then increase to a whole pill daily   fexofenadine 180 MG tablet Commonly known as:  ALLEGRA Take 180 mg by mouth daily.   losartan 50 MG tablet Commonly known as:  COZAAR Take 1 tablet (50 mg total) by mouth daily.   sertraline 50 MG tablet Commonly known as:  ZOLOFT Take 2 tablets (100 mg total) by mouth at bedtime.       Review of Systems  Unable to perform ROS: Dementia    Constitutional: Negative for activity change, appetite change, chills, diaphoresis and fever.  HENT: Negative for congestion, mouth sores, nosebleeds, postnasal drip, sneezing, sore throat, trouble swallowing and voice change.   Respiratory: Negative for apnea, cough, choking, chest tightness, shortness of breath and wheezing.   Cardiovascular: Negative for chest pain, palpitations and leg swelling.  Gastrointestinal: Negative for abdominal distention, abdominal pain, constipation, diarrhea and nausea.  Genitourinary: Negative for difficulty urinating, dysuria, frequency and urgency.  Musculoskeletal: Negative for back pain, gait problem and myalgias. Arthralgias: typical arthritis.  Skin: Positive for wound. Negative for color change, pallor and rash.  Neurological: Negative for dizziness, tremors, syncope, speech difficulty, weakness, numbness and headaches.  Psychiatric/Behavioral: Negative for agitation and behavioral problems.  All other systems reviewed and are negative.   Immunization History  Administered Date(s) Administered  . Influenza Whole 01/22/2011, 02/27/2012, 02/09/2013  . Influenza, High Dose Seasonal PF 01/10/2016  . Influenza-Unspecified 01/19/2014, 01/07/2017, 05/09/2017  . Pneumococcal Conjugate-13 02/18/2014  . Pneumococcal Polysaccharide-23 04/02/2011  . Tdap 10/17/2010  . Zoster 08/29/2006   Pertinent  Health Maintenance Due  Topic Date Due  . INFLUENZA VACCINE  11/21/2017  . DEXA SCAN  Completed  . PNA vac Low Risk Adult  Completed   Fall Risk  02/06/2017 10/29/2016 08/13/2016 06/11/2016 04/10/2016  Falls in the past year? _0   Risk for fall due to : Impaired balance/gait - - - -   Functional Status Survey:    Vitals:   07/31/17 1820  BP: (!) 109/56  Pulse: (!) 56  Resp: 20  Temp: (!) 97.4 F (36.3 C)  SpO2: 98%  Weight: 102 lb 8 oz (46.5 kg)   Body mass index is 19.69 kg/m. Physical Exam  Constitutional: She is oriented to person,  place, and time. Vital signs are normal. She appears well-developed and well-nourished. She is active and cooperative. She does not appear ill. No distress.  HENT:  Head: Normocephalic and atraumatic.  Mouth/Throat: Uvula is midline, oropharynx is clear and moist and mucous membranes are normal. Mucous membranes are not pale, not dry and not cyanotic.  Eyes: Pupils are equal, round, and reactive to light. Conjunctivae, EOM and lids are normal.  Neck: Trachea normal, normal range of motion and full passive range of motion without pain. Neck supple. No JVD present. No tracheal deviation, no edema and no erythema present. No thyromegaly present.  Cardiovascular: Normal rate, regular rhythm, intact distal pulses and normal pulses. Exam reveals no gallop, no distant heart sounds and no friction rub.  Murmur heard. Pulses:      Dorsalis pedis pulses are 2+ on the right side, and 2+ on the left side.  No edema  Pulmonary/Chest: Effort normal and breath sounds normal.  No accessory muscle usage. No respiratory distress. She has no decreased breath sounds. She has no wheezes. She has no rhonchi. She has no rales. She exhibits no tenderness.  Abdominal: Soft. Normal appearance and bowel sounds are normal. She exhibits no distension and no ascites. There is no tenderness.  Musculoskeletal: Normal range of motion. She exhibits no edema or tenderness.  Expected osteoarthritis, stiffness; Bilateral Calves soft, supple. Negative Homan's Sign. B- pedal pulses equal  Neurological: She is alert and oriented to person, place, and time. She has normal strength.  Skin: Skin is warm, dry and intact. She is not diaphoretic. No cyanosis. No pallor. Nails show no clubbing.     Psychiatric: She has a normal mood and affect. Her speech is normal and behavior is normal. Judgment and thought content normal. Cognition and memory are impaired. She exhibits abnormal recent memory.  Dementia/memory impairment.  Patient  compensates/covers very well.   Nursing note and vitals reviewed.   Labs reviewed: Recent Labs    08/13/16 1419  NA 144  K 4.0  CL 107  CO2 32  GLUCOSE 112*  BUN 17  CREATININE 0.78  CALCIUM 9.6   Recent Labs    08/13/16 1419 11/29/16 0000  AST 16  --   ALT 14  --   ALKPHOS 132*  --   BILITOT 0.7  --   PROT 6.8 6.5  ALBUMIN 4.3  --    Recent Labs    08/13/16 1419  WBC 8.8  NEUTROABS 6.8  HGB 12.5  HCT 37.4  MCV 89.1  PLT 213.0   Lab Results  Component Value Date   TSH 0.84 08/13/2016   Lab Results  Component Value Date   HGBA1C 5.6 11/29/2016   Lab Results  Component Value Date   CHOL 180 11/29/2016   HDL 53.10 11/29/2016   LDLCALC 103 (H) 11/29/2016   LDLDIRECT 139.0 10/27/2012   TRIG 118.0 11/29/2016   CHOLHDL 3 11/29/2016    Significant Diagnostic Results in last 30 days:  No results found.  Assessment/Plan Venisa was seen today for acute visit and medical management of chronic issues.  Diagnoses and all orders for this visit:  Essential hypertension  Pre-ulcerative corn or callous  Atherosclerosis of aorta ([place])   Above listed condition stable  Continue current medication regimen, except  Add compound W medicated pads to area of calluses daily  Monitor blood pressures  Monitor pulses closely for bradycardia  Encourage patient interaction with other residents and participation in activities  Monitor for safety  Labs to establish baseline  Family/ staff Communication:   Total Time:  Documentation:  Face to Face:  Family/Phone:   Labs/tests ordered: CBC, met C, magnesium, TSH, B12, D, lipid panel  Medication list reviewed and assessed for continued appropriateness. Monthly medication orders reviewed and signed.  Vikki Ports, NP-C Geriatrics [facility] [phone], [place] [address] Cell Phone (Mon-Fri 8am-5pm):  [phone] On Call:  [phone] & follow  prompts after 5pm & weekends Office Phone:  [phone] Office Fax:  [phone]

## 2017-08-13 NOTE — Assessment & Plan Note (Signed)
Stable.  No recent episodes of hyper or hypotension.  Pulse trending on the low side, so will monitor this.  Patient denies chest pain or shortness of breath.  Stable on Cozaar 50 mg p.o. daily

## 2017-08-13 NOTE — Assessment & Plan Note (Signed)
Area on the lateral aspect of bilateral great toes.  Right worse than left.  Toes are almost to the point of hammertoes.  Callus formation on the lateral side of the great toes where the second toe constantly rubs and compresses the area.  Patient complains of the protruding callus being painful to walk on.  Patient and staff reports that she has been to multiple podiatrist, all saying there was nothing they could do for it.  Patient reports this is "in her family."  Area is tough, callused but pliable tissue without ulceration currently.  No redness, warmth, bleeding or drainage.  Will attempt mild chemical exfoliation/erosion of the callused area with padding.  If this is ineffective, will consider sending to get another podiatry appointment to evaluate for the possibility of debulking for comfort.  Patient agreeable to plan.

## 2017-08-15 ENCOUNTER — Other Ambulatory Visit
Admission: RE | Admit: 2017-08-15 | Discharge: 2017-08-15 | Disposition: A | Discharge status: Home / Self Care | Payer: Medicare Other | Source: Ambulatory Visit | Attending: Gerontology | Admitting: Gerontology

## 2017-08-15 DIAGNOSIS — F0280 Dementia in other diseases classified elsewhere without behavioral disturbance: Secondary | ICD-10-CM | POA: Insufficient documentation

## 2017-08-15 LAB — LIPID PANEL
CHOL/HDL RATIO: 3 ratio
Cholesterol: 171 mg/dL (ref 0–200)
HDL: 57 mg/dL (ref >40)
LDL CALC: 98 mg/dL (ref 0–99)
Triglycerides: 80 mg/dL (ref <150)
VLDL: 16 mg/dL (ref 0–40)

## 2017-08-15 LAB — CBC WITH DIFFERENTIAL/PLATELET
Basophils Absolute: 0 10*3/uL (ref 0–0.1)
Basophils Relative: 1 %
Eosinophils Absolute: 0.3 10*3/uL (ref 0–0.7)
Eosinophils Relative: 4 %
HEMATOCRIT: 35.9 % (ref 35.0–47.0)
HEMOGLOBIN: 11.5 g/dL — AB (ref 12.0–16.0)
LYMPHS PCT: 16 %
Lymphs Abs: 1.3 10*3/uL (ref 1.0–3.6)
MCH: 28.1 pg (ref 26.0–34.0)
MCHC: 32 g/dL (ref 32.0–36.0)
MCV: 87.6 fL (ref 80.0–100.0)
MONO ABS: 0.9 10*3/uL (ref 0.2–0.9)
Monocytes Relative: 11 %
NEUTROS ABS: 5.4 10*3/uL (ref 1.4–6.5)
NEUTROS PCT: 68 %
Platelets: 179 10*3/uL (ref 150–440)
RBC: 4.09 MIL/uL (ref 3.80–5.20)
RDW: 13.9 % (ref 11.5–14.5)
WBC: 7.9 10*3/uL (ref 3.6–11.0)

## 2017-08-15 LAB — COMPREHENSIVE METABOLIC PANEL
ALT: 17 U/L (ref 14–54)
AST: 21 U/L (ref 15–41)
Albumin: 3.4 g/dL — ABNORMAL LOW (ref 3.5–5.0)
Alkaline Phosphatase: 177 U/L — ABNORMAL HIGH (ref 38–126)
Anion gap: 5 (ref 5–15)
BILIRUBIN TOTAL: 0.5 mg/dL (ref 0.3–1.2)
BUN: 26 mg/dL — ABNORMAL HIGH (ref 6–20)
CHLORIDE: 106 mmol/L (ref 101–111)
CO2: 30 mmol/L (ref 22–32)
Calcium: 8.7 mg/dL — ABNORMAL LOW (ref 8.9–10.3)
Creatinine, Ser: 0.8 mg/dL (ref 0.44–1.00)
Glucose, Bld: 91 mg/dL (ref 65–99)
POTASSIUM: 4.5 mmol/L (ref 3.5–5.1)
Sodium: 141 mmol/L (ref 135–145)
TOTAL PROTEIN: 6.1 g/dL — AB (ref 6.5–8.1)

## 2017-08-15 LAB — TSH: TSH: 0.712 u[IU]/mL (ref 0.350–4.500)

## 2017-08-15 LAB — MAGNESIUM: MAGNESIUM: 2.2 mg/dL (ref 1.7–2.4)

## 2017-08-15 LAB — VITAMIN B12: VITAMIN B 12: 339 pg/mL (ref 180–914)

## 2017-08-16 DIAGNOSIS — R509 Fever, unspecified: Secondary | ICD-10-CM | POA: Diagnosis not present

## 2017-08-16 LAB — VITAMIN D 25 HYDROXY (VIT D DEFICIENCY, FRACTURES): Vit D, 25-Hydroxy: 27.1 ng/mL — ABNORMAL LOW (ref 30.0–100.0)

## 2017-08-18 ENCOUNTER — Encounter: Payer: Self-pay | Admitting: Emergency Medicine

## 2017-08-18 ENCOUNTER — Emergency Department
Admission: EM | Admit: 2017-08-18 | Discharge: 2017-08-18 | Disposition: A | Discharge status: Home / Self Care | Payer: Medicare Other | Attending: Emergency Medicine | Admitting: Emergency Medicine

## 2017-08-18 ENCOUNTER — Emergency Department: Payer: Medicare Other

## 2017-08-18 DIAGNOSIS — F0390 Unspecified dementia without behavioral disturbance: Secondary | ICD-10-CM | POA: Insufficient documentation

## 2017-08-18 DIAGNOSIS — R05 Cough: Secondary | ICD-10-CM | POA: Diagnosis not present

## 2017-08-18 DIAGNOSIS — R55 Syncope and collapse: Secondary | ICD-10-CM

## 2017-08-18 DIAGNOSIS — M6281 Muscle weakness (generalized): Secondary | ICD-10-CM | POA: Diagnosis not present

## 2017-08-18 DIAGNOSIS — Z79899 Other long term (current) drug therapy: Secondary | ICD-10-CM | POA: Insufficient documentation

## 2017-08-18 DIAGNOSIS — I10 Essential (primary) hypertension: Secondary | ICD-10-CM | POA: Diagnosis not present

## 2017-08-18 DIAGNOSIS — Z87891 Personal history of nicotine dependence: Secondary | ICD-10-CM | POA: Insufficient documentation

## 2017-08-18 DIAGNOSIS — Z85828 Personal history of other malignant neoplasm of skin: Secondary | ICD-10-CM | POA: Insufficient documentation

## 2017-08-18 DIAGNOSIS — N390 Urinary tract infection, site not specified: Secondary | ICD-10-CM

## 2017-08-18 DIAGNOSIS — Z7982 Long term (current) use of aspirin: Secondary | ICD-10-CM | POA: Insufficient documentation

## 2017-08-18 DIAGNOSIS — R6889 Other general symptoms and signs: Secondary | ICD-10-CM | POA: Diagnosis not present

## 2017-08-18 HISTORY — DX: Unspecified dementia, unspecified severity, without behavioral disturbance, psychotic disturbance, mood disturbance, and anxiety: F03.90

## 2017-08-18 LAB — URINALYSIS, COMPLETE (UACMP) WITH MICROSCOPIC
Bilirubin Urine: NEGATIVE
Glucose, UA: NEGATIVE mg/dL
Hgb urine dipstick: NEGATIVE
Ketones, ur: NEGATIVE mg/dL
NITRITE: POSITIVE — AB
PROTEIN: 30 mg/dL — AB
SPECIFIC GRAVITY, URINE: 1.013 (ref 1.005–1.030)
pH: 5 (ref 5.0–8.0)

## 2017-08-18 LAB — CBC
HCT: 36 % (ref 35.0–47.0)
HEMOGLOBIN: 11.8 g/dL — AB (ref 12.0–16.0)
MCH: 28.3 pg (ref 26.0–34.0)
MCHC: 32.7 g/dL (ref 32.0–36.0)
MCV: 86.8 fL (ref 80.0–100.0)
Platelets: 167 10*3/uL (ref 150–440)
RBC: 4.15 MIL/uL (ref 3.80–5.20)
RDW: 13.8 % (ref 11.5–14.5)
WBC: 10.1 10*3/uL (ref 3.6–11.0)

## 2017-08-18 LAB — BASIC METABOLIC PANEL
ANION GAP: 6 (ref 5–15)
BUN: 25 mg/dL — ABNORMAL HIGH (ref 6–20)
CALCIUM: 8.7 mg/dL — AB (ref 8.9–10.3)
CO2: 30 mmol/L (ref 22–32)
CREATININE: 0.97 mg/dL (ref 0.44–1.00)
Chloride: 101 mmol/L (ref 101–111)
GFR calc Af Amer: >60 mL/min (ref >60)
GFR, EST NON AFRICAN AMERICAN: 52 mL/min — AB (ref >60)
GLUCOSE: 103 mg/dL — AB (ref 65–99)
Potassium: 4.1 mmol/L (ref 3.5–5.1)
Sodium: 137 mmol/L (ref 135–145)

## 2017-08-18 LAB — TROPONIN I

## 2017-08-18 MED ORDER — SODIUM CHLORIDE 0.9 % IV BOLUS
500.0000 mL | INTRAVENOUS | Status: AC
  Administered 2017-08-18: 500 mL via INTRAVENOUS

## 2017-08-18 MED ORDER — CEPHALEXIN 500 MG PO CAPS: 500.0000 mg | ORAL_CAPSULE | Freq: Two times a day (BID) | ORAL | 0 refills | Status: DC

## 2017-08-18 MED ORDER — SODIUM CHLORIDE 0.9 % IV SOLN
1.0000 g | INTRAVENOUS | Status: AC
  Administered 2017-08-18: 1 g via INTRAVENOUS
  Filled 2017-08-18: qty 10

## 2017-08-18 NOTE — Discharge Instructions (Addendum)
You have been seen today in the Emergency Department (ED)  for near syncope (almost passing out).  Your workup including labs and EKG show reassuring results except that it does appear that you have a urinary tract infection.  We treated with a first dose of antibiotics (ceftriaxone 1 gram IV) and wrote a prescription for you to take twice daily for the next week.  Be sure to drink plenty of non-alcoholic fluids to stay hydrated.  Please call your regular doctor as soon as possible to schedule the next available clinic appointment to follow up with him/her regarding your visit to the ED and your symptoms.  Return to the Emergency Department (ED)  if you have any further syncopal episodes (pass out again) or develop ANY chest pain, pressure, tightness, trouble breathing, sudden sweating, or other symptoms that concern you.

## 2017-08-18 NOTE — ED Notes (Signed)
Patient transported to X-ray 

## 2017-08-18 NOTE — ED Triage Notes (Signed)
Pt comes into the ED via [facility] from [facility] where she had a near syncopal episode when the CNA tried to stand her up to get her in the shower.  Patients BP with the facility was 80/30's and with EMS it was 84/41.  Patient is neurologically intact at her baseline and denies any complaints at this time.  Patient in NAD with even and unlabored respirations.

## 2017-08-18 NOTE — ED Notes (Signed)
Patient ambulated to the restroom, and no difficulty noted.  Patient in NAD at this time with steady gait and no weakness noted out of baseline.

## 2017-08-18 NOTE — ED Provider Notes (Signed)
[facility] Emergency Department Provider Note  ____________________________________________   First MD Initiated Contact with Patient 08/18/17 1231     (approximate)  I have reviewed the triage vital signs and the nursing notes.   HISTORY  Chief Complaint Near Syncope  Level 5 caveat:  history/ROS limited by chronic dementia  HPI Maria Bass is a 84 y.o. female whose medical history notably includes dementia who presents by EMS from her living facility for evaluation of a near syncopal episode.  Reportedly she had some of the staff there with her trying to help her up from bed and get her into the shower and she almost passed out.  She did not fall or sustain any injuries.  Her blood pressure was low at the facility in the 80s over 30s and that was consistent when EMS first arrived.  When she arrived to the emergency department her blood pressure was about 99/59 and she is neurologically intact and at her baseline.  She denies any current symptoms but she does not remember what happened, consistent with her history of dementia.  She currently denies headache, chest pain, shortness of breath, nausea, vomiting, abdominal pain, and dysuria.  Reportedly the onset of her symptoms was acute and they were severe but she seems to be at her baseline now.  Past Medical History:  Diagnosis Date  . Anxiety   . Dementia   . Depression   . Hyperlipidemia   . Hypertension   . IBS (irritable bowel syndrome)    per pt- not dx as IBS, but has IBS "symptoms"  . MAI (mycobacterium avium-intracellulare) ([place]) 2003   treated at [facility]  . Skin cancer     Patient Active Problem List   Diagnosis Date Noted  . Pre-ulcerative corn or callous 08/13/2017  . GAD (generalized anxiety disorder) 02/13/2017  . Neuropathy 10/29/2016  . Insomnia 10/29/2016  . Atherosclerosis of aorta ([place]) 09/25/2016  . Abnormal chest x-ray with multiple lung nodules 07/09/2016  . Aortic valve  stenosis 07/30/2014  . Palpitations 07/13/2014  . Esophageal reflux 02/18/2014  . Stool incontinence 02/18/2014  . Alzheimer's dementia 10/27/2012  . Allergic rhinitis 11/28/2011  . Female bladder prolapse 10/30/2011  . Irritable bowel syndrome with diarrhea 10/30/2011  . Heart murmur 05/04/2011  . Hypertension 12/26/2010  . Hyperlipidemia 12/26/2010  . Depression 12/26/2010    Past Surgical History:  Procedure Laterality Date  . ABDOMINAL HYSTERECTOMY    . BLADDER REPAIR    . CATARACT EXTRACTION, BILATERAL    . TONSILLECTOMY      Prior to Admission medications   Medication Sig Start Date End Date Taking? Authorizing Provider  aspirin EC 325 MG tablet Take 325 mg by mouth daily.     [provider]  atorvastatin (LIPITOR) 40 MG tablet Take 1 tablet (40 mg total) by mouth daily. 01/07/17   Burnard Hawthorne, [name]  cephALEXin (KEFLEX) 500 MG capsule Take 1 capsule (500 mg total) by mouth 2 (two) times daily. 08/18/17   Hinda Kehr, MD  cholestyramine Lucrezia Starch) 4 g packet Take 4 grams in juice or water once daily. Take at lunch time. ( take 2 hours away from other medications. ) 04/12/17   [place], Amy S, PA-C  donepezil (ARICEPT) 10 MG tablet Start with 1/2 pill for one week then increase to a whole pill daily 01/09/17   Melvenia Beam, MD  fexofenadine (ALLEGRA) 180 MG tablet Take 180 mg by mouth daily.    [provider]  losartan (COZAAR) 50 MG tablet Take 1 tablet (50 mg total) by mouth daily. 01/10/17   Burnard Hawthorne, [name]  sertraline (ZOLOFT) 50 MG tablet Take 2 tablets (100 mg total) by mouth at bedtime. 06/19/17   Burnard Hawthorne, [name]    Allergies Patient has no known allergies.  Family History  Problem Relation Age of Onset  . Heart attack Mother   . Heart attack Father   . Diabetes Father   . Colon cancer Neg Hx   . Esophageal cancer Neg Hx   . Stomach cancer Neg Hx     Social History Social History   Tobacco Use  . Smoking status:  Former Smoker    Types: Cigarettes    Last attempt to quit: 04/23/1978    Years since quitting: 39.3  . Smokeless tobacco: Never Used  . Tobacco comment: social smoker x 15  Substance Use Topics  . Alcohol use: Yes    Alcohol/week: 4.2 oz    Types: 7 Glasses of wine per week    Comment: glass a wine at night  . Drug use: No    Review of Systems  Level 5 caveat:  history/ROS limited by chronic dementia ____________________________________________   PHYSICAL EXAM:  VITAL SIGNS: ED Triage Vitals [08/18/17 1149]  Enc Vitals Group     BP (!) 99/59     Pulse Rate 69     Resp 18     Temp 98.1 F (36.7 C)     Temp Source Oral     SpO2 95 %     Weight 45.4 kg (100 lb)     Height 1.575 m (5\' 2" )     Head Circumference      Peak Flow      Pain Score 0     Pain Loc      Pain Edu?      Excl. in [place]?     Constitutional: Alert and oriented to self. Well appearing and in no acute distress. Eyes: Conjunctivae are normal. PERRL. EOMI. Head: Atraumatic. Nose: No congestion/rhinnorhea. Mouth/Throat: Mucous membranes are moist. Neck: No stridor.  No meningeal signs.   Cardiovascular: Normal rate, regular rhythm. Good peripheral circulation. Grossly normal heart sounds. Respiratory: Normal respiratory effort.  No retractions. Lungs CTAB. Gastrointestinal: Soft and nontender. No distention.  Musculoskeletal: No lower extremity tenderness nor edema. No gross deformities of extremities. Neurologic:  Normal speech and language. No gross focal neurologic deficits are appreciated.  Skin:  Skin is warm, dry and intact. No rash noted. Psychiatric: Mood and affect are normal. Speech and behavior are normal.  ____________________________________________   LABS (all labs ordered are listed, but only abnormal results are displayed)  Labs Reviewed  BASIC METABOLIC PANEL - Abnormal; Notable for the following components:      Result Value   Glucose, Bld 103 (*)    BUN 25 (*)    Calcium 8.7  (*)    GFR calc non Af Amer 52 (*)    All other components within normal limits  CBC - Abnormal; Notable for the following components:   Hemoglobin 11.8 (*)    All other components within normal limits  URINALYSIS, COMPLETE (UACMP) WITH MICROSCOPIC - Abnormal; Notable for the following components:   Color, Urine YELLOW (*)    APPearance CLOUDY (*)    Protein, ur 30 (*)    Nitrite POSITIVE (*)    Leukocytes, UA LARGE (*)    WBC, UA >50 (*)    Bacteria, UA  MANY (*)    Non Squamous Epithelial 0-5 (*)    All other components within normal limits  URINE CULTURE  TROPONIN I  CBG MONITORING, ED   ____________________________________________  EKG  ED ECG REPORT I, Hinda Kehr, the attending physician, personally viewed and interpreted this ECG.  Date: 08/18/2017 EKG Time: 11:56 AM Rate: 73 Rhythm: normal sinus rhythm QRS Axis: normal Intervals: normal ST/T Wave abnormalities: normal Narrative Interpretation: no evidence of acute ischemia.  The computer is interpreting supraventricular bigeminy, but I do not appreciate that.  I believe that she has an element of sinus arrhythmia but otherwise reassuring and unremarkable EKG  ____________________________________________  RADIOLOGY I, Hinda Kehr, personally viewed and evaluated these images (plain radiographs) as part of my medical decision making, as well as reviewing the written report by the radiologist.  ED MD interpretation:  No acute findings  Official radiology report(s): Dg Chest 2 View  Result Date: 08/18/2017 CLINICAL DATA:  Syncope and cough EXAM: CHEST - 2 VIEW COMPARISON:  06/25/2016 FINDINGS: Masslike opacity is adjacent to the left heart border. Irregular focal opacity in the right lower lung zone. Heterogeneous opacities throughout the upper lungs. Overall, findings are stable compared to the prior study. No pneumothorax. No pleural effusion. Hyperaeration. There is volume loss in the lingula and right middle  lobe. Normal heart size. IMPRESSION: Chronic changes. Electronically Signed   By: Marybelle Killings M.D.   On: 08/18/2017 13:43    ____________________________________________   PROCEDURES  Critical Care performed: No   Procedure(s) performed:   Procedures   ____________________________________________   INITIAL IMPRESSION / ASSESSMENT AND PLAN / ED COURSE  As part of my medical decision making, I reviewed the following data within the [facility] notes reviewed and incorporated, Labs reviewed , EKG interpreted  and Old chart reviewed    Differential diagnosis includes, but is not limited to, vasovagal episode, volume depletion, cardiogenic syncope, infectious process such as UTI.  Patient is very well appearing, no acute distress.  Blood pressure little bit on the low side but this could be from a mild and chronic volume depletion.  I am giving 500 mL NS and performing a broad workup.  Doubt cardiogenic syncope and EKG is WNL.  Clinical Course as of Aug 18 1498  Sun Aug 18, 2017  1402 No acute changes on CXR  DG Chest 2 View [CF]  1451 Urinalysis does appear grossly infected and I have sent a urine culture and will treat with 1 g ceftriaxone.  The rest of the work-up has been reassuring and the patient has been asymptomatic.  I will discharge back to her living facility with a prescription for Keflex and recommendation for close outpatient follow-up.  The patient remains in no acute distress with good vital signs.   [CF]  1027 Metabolic panel is reassuring, troponin is negative, and CBC is within normal limits   [CF]    Clinical Course User Index [CF] Hinda Kehr, MD    ____________________________________________  FINAL CLINICAL IMPRESSION(S) / ED DIAGNOSES  Final diagnoses:  Near syncope  Urinary tract infection without hematuria, site unspecified     MEDICATIONS GIVEN DURING THIS VISIT:  Medications  cefTRIAXone (ROCEPHIN) 1 g in sodium  chloride 0.9 % 100 mL IVPB (has no administration in time range)  sodium chloride 0.9 % bolus 500 mL (0 mLs Intravenous Stopped 08/18/17 1358)  sodium chloride 0.9 % bolus 500 mL (500 mLs Intravenous New Bag/Given 08/18/17 1359)  ED Discharge Orders        Ordered    cephALEXin (KEFLEX) 500 MG capsule  2 times daily     08/18/17 1452       Note:  This document was prepared using Dragon voice recognition software and may include unintentional dictation errors.    Hinda Kehr, MD 08/18/17 1500

## 2017-08-18 NOTE — ED Notes (Signed)
Patient transported to XR. 

## 2017-08-19 ENCOUNTER — Encounter: Payer: Self-pay | Admitting: Neurology

## 2017-08-20 DIAGNOSIS — R05 Cough: Secondary | ICD-10-CM | POA: Diagnosis not present

## 2017-08-20 DIAGNOSIS — R062 Wheezing: Secondary | ICD-10-CM | POA: Diagnosis not present

## 2017-08-20 LAB — URINE CULTURE
Culture: >=100000 — AB
Special Requests: NORMAL

## 2017-08-21 ENCOUNTER — Encounter
Admission: RE | Admit: 2017-08-21 | Discharge: 2017-08-21 | Disposition: A | Discharge status: Home / Self Care | Payer: Medicare Other | Source: Ambulatory Visit | Attending: Internal Medicine | Admitting: Internal Medicine

## 2017-08-21 ENCOUNTER — Encounter: Payer: Self-pay | Admitting: Neurology

## 2017-08-28 ENCOUNTER — Telehealth: Payer: Self-pay | Admitting: Neurology

## 2017-08-28 DIAGNOSIS — Z9181 History of falling: Secondary | ICD-10-CM

## 2017-08-28 DIAGNOSIS — G44309 Post-traumatic headache, unspecified, not intractable: Secondary | ICD-10-CM

## 2017-08-28 NOTE — Telephone Encounter (Signed)
Maria Bass, I forwarded an email to ypu yesterday and asked you to call if you could. Here is another email from patient's daughter. I can't address this is email. Here is more info for when you call. Please discuss with the work-in doc. Thanks    Hello Dr. Loni Muse. Thank you for on going support. Today marks 4 weeks since Mom moved into her AL room at [facility].  Since her fall and UTI a few weeks ago she has has a marked decline.  On a recent phone call she said she was lost and couldnt get her legs or arms moving. Clearly depressed.  The PA there, Larene Beach is sliwly taking her dosage down with [place] as she was erroneously put back on 28 mg when she moved. She dropped to 21 and goal is 14.  Please let me know if you feel she needs any tests. She hit her head hard on her fall and likely had a concussion. The move, her fall, UTI, sinus virus, and the fact that I sold my [place] home right after her move and she has no family closeby are reasons enough to be concerned.  Please let us know if we should be concerned.  She remains scattered and loopy on our calls.  I will be visiting her soon and will do whatever follow up you suggest ( or just be there and advocate!  So very sad :(. Thank you so much!

## 2017-08-28 NOTE — Telephone Encounter (Signed)
Called pt's daughter Lamar Laundry (on [place]) and LVM asking for call back to discuss the patient. Left office number in message.

## 2017-08-29 ENCOUNTER — Telehealth: Payer: Self-pay | Admitting: Neurology

## 2017-08-29 NOTE — Telephone Encounter (Signed)
Error she is going to [employer]

## 2017-08-29 NOTE — Telephone Encounter (Signed)
Patient fell and hit her head I would recommend doing a CT head without contrast, especially since she is on daily aspirin adult size. Please explained to daughter. I will place an order for head CT without contrast.

## 2017-08-29 NOTE — Telephone Encounter (Signed)
Spoke with patient's daughter. Explained that work-in doctor has ordered a CT given the fact that the patient fell and hit her head and is on an adult aspirin. The daughter verbalized understanding. She is aware that it will be ran through insurance and then will call to get this scheduled. Patient lives in [place]. RN suggested we can try to get that scheduled near [place] if possible[place]  She asked if the patient needs to be seen sooner. RN advised at this time the best thing would be to get the CT done and then we will go from there.Dicussed that in the meantime if the patient develops any acute neurological changes including confusion, weakness, etc she needs to be taken to the hospital right away for evaluation. Mardene Celeste verbalized understanding of all and was appreciative.

## 2017-08-29 NOTE — Telephone Encounter (Signed)
Pt's daughter returned RN's call °

## 2017-08-29 NOTE — Telephone Encounter (Signed)
Spoke with pt's daughter Maria Bass. She stated that [facility] put the patient back on full dose of Namenda 28 mg. They are weaning her off at this time. The patient also takes Aricept 10 mg. She is no longer having diarrhea. Maria Bass stated that the pt fell and hit her head about 2 weeks ago. She also had a fainting spell and UTI and sinus infection. She has been seen by doctor but did not have a CT. The daughter does not want one done unnecessarily unless MD feels she needs one. RN asked if pt has had any slurred speech or unilateral weakness, facial droop, or complaining of headache. The daughter has not seen her since 08/11/17 so she was not sure however she denied noticing any slurred speech. The pt had only complained of a headache right after the fall. She stated that her mother has taken the move hard and she has seemed to decline. She participates less in activities, she seems loopy/spacey. The patient was found trying on a new outfit in the hallway at her facility. However the patient told the daughter that she feels better and the daughter was told by the nurse that the patient seems to be better. The pt's UTI was treated with antibiotics but the daughter would like for her to be tested again. RN advised for daughter to ask the MD or PA that sees the patient at the facility for this. Discussed that infections can worsen patient behaviors and confusion in the setting of Dementia and it can take time after resolution of infection to see how she does. She was also told that the RN would notify the work-in MD as well as Dr. Jaynee Bass (she is out of the office). Patient may need to be seen sooner than August. Maria Bass verbalized understanding and was very appreciative.

## 2017-08-29 NOTE — Addendum Note (Signed)
Addended by: Star Age on: 08/29/2017 03:24 PM   Modules accepted: Orders

## 2017-08-29 NOTE — Telephone Encounter (Signed)
Medicare/aetna order sent to GI. Gi will obtain the auth and will reach out to the pt to schedule.

## 2017-08-30 ENCOUNTER — Other Ambulatory Visit
Admission: RE | Admit: 2017-08-30 | Discharge: 2017-08-30 | Disposition: A | Discharge status: Home / Self Care | Payer: Medicare Other | Source: Ambulatory Visit | Attending: Internal Medicine | Admitting: Internal Medicine

## 2017-08-30 DIAGNOSIS — F0280 Dementia in other diseases classified elsewhere without behavioral disturbance: Secondary | ICD-10-CM | POA: Insufficient documentation

## 2017-09-01 LAB — URINE CULTURE: Culture: >=100000 — AB

## 2017-09-06 ENCOUNTER — Non-Acute Institutional Stay: Payer: Medicare Other | Admitting: Gerontology

## 2017-09-06 ENCOUNTER — Encounter: Payer: Self-pay | Admitting: Gerontology

## 2017-09-06 ENCOUNTER — Encounter: Payer: Self-pay | Admitting: Neurology

## 2017-09-06 DIAGNOSIS — K580 Irritable bowel syndrome with diarrhea: Secondary | ICD-10-CM

## 2017-09-06 DIAGNOSIS — J309 Allergic rhinitis, unspecified: Secondary | ICD-10-CM | POA: Diagnosis not present

## 2017-09-06 DIAGNOSIS — I350 Nonrheumatic aortic (valve) stenosis: Secondary | ICD-10-CM

## 2017-09-06 NOTE — Progress Notes (Signed)
Location:   [facility] Room Number: [place]:  ALF [phone]) Provider:  Toni Arthurs, NP-C  [name], [name], [name]  Patient Care Team: Burnard Hawthorne, [name] as PCP - General (Family Medicine) Elsie Stain, MD (Pulmonary Disease)  Extended Emergency Contact Information Primary Emergency Contact: Bardin,Patricia Address: [address]          [place], [place] [address] Maria Bass of [place] Mobile Phone: [phone] Relation: Daughter  Code Status:  FULL Goals of care: Advanced Directive information Advanced Directives 08/18/2017  Does Patient Have a Medical Advance Directive? Yes  Type of Advance Directive Living will;Healthcare Power of Attorney  Does patient want to make changes to medical advance directive? -  Copy of [place] in Chart? -     Chief Complaint  Patient presents with  . Medical Management of Chronic Issues    Routine Visit    HPI:  Pt is a 84 y.o. female seen today for medical management of chronic diseases.    Aortic valve stenosis, etiology of cardiac valve disease unspecified Stable. Assymptomatic, denies chest pain or shortness of breath.   Allergic rhinitis, unspecified seasonality, unspecified trigger Stable. Symptoms controlled with Flonase 50 mcg- 1 spray each nostril Q Day and Zyrtec 5 mg po Q HS. Denies exacerbation.   Irritable bowel syndrome with diarrhea Stable. No c/o recent exacerbations/ no report of diarrhea. Symptoms controlled with Questran 4 G daily prn for diarrhea.   Please note pt with limited verbal/cognitive ability. Unable to obtain complete ROS. Some ROS info obtained from staff and documentation.   Past Medical History:  Diagnosis Date  . Anxiety   . Dementia   . Depression   . Hyperlipidemia   . Hypertension   . IBS (irritable bowel syndrome)    per pt- not dx as IBS, but has IBS "symptoms"  . MAI (mycobacterium avium-intracellulare) ([place]) 2003   treated at [facility]  . Skin cancer    Past Surgical History:  Procedure Laterality Date  . ABDOMINAL HYSTERECTOMY    . BLADDER REPAIR    . CATARACT EXTRACTION, BILATERAL    . TONSILLECTOMY      No Known Allergies  Allergies as of 09/06/2017   No Known Allergies     Medication List        Accurate as of 09/06/17  2:24 PM. Always use your most recent med list.          acetaminophen 325 MG tablet Commonly known as:  TYLENOL Take 650 mg by mouth every 4 (four) hours as needed. for pain and/or increased temp. May be administered orally (via gastric tube if present), or rectally - if patient unable to take orally.   aspirin EC 325 MG tablet Take 325 mg by mouth daily.   cetirizine 10 MG tablet Commonly known as:  ZYRTEC Take 5 mg by mouth at bedtime.   Cholecalciferol 2000 units Caps Take 1 capsule by mouth daily.   cholestyramine 4 g packet Commonly known as:  QUESTRAN Take 4 g by mouth daily as needed.   COMPOUND W 40 % Pads Generic drug:  Salicylic Acid Apply 1 patch topically daily. Apply to calloused area on inside of Great Toe of Right Foot.   Cranberry 500 MG Caps Take 1 capsule by mouth daily.   donepezil 10 MG tablet Commonly known as:  ARICEPT Take 10 mg by mouth at bedtime.   ENSURE ENLIVE PO Take 1 Bottle by mouth  2 (two) times daily.   fluticasone 50 MCG/ACT nasal spray Commonly known as:  FLONASE Place 1 spray into both nostrils daily.   Melatonin 5 MG Tabs Take 1 tablet by mouth at bedtime.   memantine 7 MG Cp24 24 hr capsule Commonly known as:  NAMENDA XR Take 7 mg by mouth daily.   mirtazapine 45 MG tablet Commonly known as:  REMERON Take 45 mg by mouth at bedtime.   sertraline 50 MG tablet Commonly known as:  ZOLOFT Take 50 mg by mouth daily.       Review of Systems  Unable to perform ROS: Dementia  Constitutional: Negative for activity change, appetite change, chills, diaphoresis and fever.  HENT: Negative for congestion, mouth  sores, nosebleeds, postnasal drip, sneezing, sore throat, trouble swallowing and voice change.   Respiratory: Negative for apnea, cough, choking, chest tightness, shortness of breath and wheezing.   Cardiovascular: Negative for chest pain, palpitations and leg swelling.  Gastrointestinal: Negative for abdominal distention, abdominal pain, constipation, diarrhea and nausea.  Genitourinary: Negative for difficulty urinating, dysuria, frequency and urgency.  Musculoskeletal: Negative for back pain, gait problem and myalgias. Arthralgias: typical arthritis.  Skin: Negative for color change, pallor, rash and wound.  Neurological: Positive for weakness. Negative for dizziness, tremors, syncope, speech difficulty, numbness and headaches.  Psychiatric/Behavioral: Negative for agitation and behavioral problems.  All other systems reviewed and are negative.   Immunization History  Administered Date(s) Administered  . Influenza Whole 01/22/2011, 02/27/2012, 02/09/2013  . Influenza, High Dose Seasonal PF 01/10/2016  . Influenza,inj,quad, With Preservative 05/09/2017  . Influenza-Unspecified 01/19/2014, 01/07/2017, 05/09/2017  . Pneumococcal Conjugate-13 02/18/2014  . Pneumococcal Polysaccharide-23 04/02/2011  . Tdap 10/17/2010  . Zoster 08/29/2006   Pertinent  Health Maintenance Due  Topic Date Due  . INFLUENZA VACCINE  11/21/2017  . DEXA SCAN  Completed  . PNA vac Low Risk Adult  Completed   Fall Risk  02/06/2017 10/29/2016 08/13/2016 06/11/2016 04/10/2016  Falls in the past year? No No No No No  Risk for fall due to : Impaired balance/gait - - - -   Functional Status Survey:    Vitals:   09/06/17 1145  BP: 121/63  Pulse: 61  Resp: 20  Temp: (!) 97.4 F (36.3 C)  TempSrc: Oral  SpO2: 93%  Weight: 102 lb 8 oz (46.5 kg)  Height: 5\' 2"  (1.575 m)   Body mass index is 18.75 kg/m. Physical Exam  Constitutional: She is oriented to person, place, and time. Vital signs are normal. She  appears well-developed and well-nourished. She is active and cooperative. She does not appear ill. No distress.  HENT:  Head: Normocephalic and atraumatic.  Mouth/Throat: Uvula is midline, oropharynx is clear and moist and mucous membranes are normal. Mucous membranes are not pale, not dry and not cyanotic.  Eyes: Pupils are equal, round, and reactive to light. Conjunctivae, EOM and lids are normal.  Neck: Trachea normal, normal range of motion and full passive range of motion without pain. Neck supple. No JVD present. No tracheal deviation, no edema and no erythema present. No thyromegaly present.  Cardiovascular: Normal rate, regular rhythm, intact distal pulses and normal pulses. Exam reveals no gallop, no distant heart sounds and no friction rub.  Murmur heard. Pulses:      Dorsalis pedis pulses are 2+ on the right side, and 2+ on the left side.  No edema  Pulmonary/Chest: Effort normal and breath sounds normal. No accessory muscle usage. No respiratory distress. She has no  decreased breath sounds. She has no wheezes. She has no rhonchi. She has no rales. She exhibits no tenderness.  Abdominal: Soft. Normal appearance and bowel sounds are normal. She exhibits no distension and no ascites. There is no tenderness.  Musculoskeletal: Normal range of motion. She exhibits no edema or tenderness.  Expected osteoarthritis, stiffness; Bilateral Calves soft, supple. Negative Homan's Sign. B- pedal pulses equal; mobile with rollator   Neurological: She is alert and oriented to person, place, and time. She has normal strength.  Skin: Skin is warm, dry and intact. She is not diaphoretic. No cyanosis. No pallor. Nails show no clubbing.  Psychiatric: She has a normal mood and affect. Her speech is normal and behavior is normal. Thought content is delusional. Cognition and memory are impaired. She expresses impulsivity. She exhibits abnormal recent memory and abnormal remote memory.  Nursing note and vitals  reviewed.   Labs reviewed: Recent Labs    08/15/17 0430 08/18/17 1152  NA 141 137  K 4.5 4.1  CL 106 101  CO2 30 30  GLUCOSE 91 103*  BUN 26* 25*  CREATININE 0.80 0.97  CALCIUM 8.7* 8.7*  MG 2.2  --    Recent Labs    11/29/16 0000 08/15/17 0430  AST  --  21  ALT  --  17  ALKPHOS  --  177*  BILITOT  --  0.5  PROT 6.5 6.1*  ALBUMIN  --  3.4*   Recent Labs    08/15/17 0430 08/18/17 1152  WBC 7.9 10.1  NEUTROABS 5.4  --   HGB 11.5* 11.8*  HCT 35.9 36.0  MCV 87.6 86.8  PLT 179 167   Lab Results  Component Value Date   TSH 0.712 08/15/2017   Lab Results  Component Value Date   HGBA1C 5.6 11/29/2016   Lab Results  Component Value Date   CHOL 171 08/15/2017   HDL 57 08/15/2017   LDLCALC 98 08/15/2017   LDLDIRECT 139.0 10/27/2012   TRIG 80 08/15/2017   CHOLHDL 3.0 08/15/2017    Significant Diagnostic Results in last 30 days:  Dg Chest 2 View  Result Date: 08/18/2017 CLINICAL DATA:  Syncope and cough EXAM: CHEST - 2 VIEW COMPARISON:  06/25/2016 FINDINGS: Masslike opacity is adjacent to the left heart border. Irregular focal opacity in the right lower lung zone. Heterogeneous opacities throughout the upper lungs. Overall, findings are stable compared to the prior study. No pneumothorax. No pleural effusion. Hyperaeration. There is volume loss in the lingula and right middle lobe. Normal heart size. IMPRESSION: Chronic changes. Electronically Signed   By: Marybelle Killings M.D.   On: 08/18/2017 13:43    Assessment/Plan  Aortic valve stenosis, etiology of cardiac valve disease unspecified  Allergic rhinitis, unspecified seasonality, unspecified trigger  Irritable bowel syndrome with diarrhea   Above listed conditions stable  Continue current medication regimen  Continue to encourage pt participation in activities and interaction with other residents  Safety precautions  Fall precautions  Monitor for persistent diarrhea  Monitor for chest pain or  shortness of breath  Nursing to speak with family regarding moving pt to memory care side for safety and socialization   Family/ staff Communication:   Total Time:  Documentation:  Face to Face:  Family/Phone:   Labs/tests ordered:  Recent labs reviewed- stable  Medication list reviewed and assessed for continued appropriateness. Monthly medication orders reviewed and signed.  Vikki Ports, NP-C Geriatrics [facility] [phone] [address]  [place], [place] [address] Cell Phone (Mon-Fri 8am-5pm):  [phone] On Call:  [phone] & follow prompts after 5pm & weekends Office Phone:  [phone] Office Fax:  [phone]

## 2017-09-10 ENCOUNTER — Other Ambulatory Visit: Payer: Self-pay | Admitting: Neurology

## 2017-09-10 ENCOUNTER — Telehealth: Payer: Self-pay | Admitting: Neurology

## 2017-09-10 DIAGNOSIS — S0990XA Unspecified injury of head, initial encounter: Secondary | ICD-10-CM

## 2017-09-10 DIAGNOSIS — R410 Disorientation, unspecified: Secondary | ICD-10-CM

## 2017-09-10 DIAGNOSIS — W19XXXA Unspecified fall, initial encounter: Secondary | ICD-10-CM

## 2017-09-10 NOTE — Telephone Encounter (Signed)
[place]: Can we get patient in per daughter's request below? Give daughter Precious Bard a call thanks  EMAIIL:  I can have mom come see you Thursday or Friday next week, 30th or 31st, if works for you. Just let me know. Thanks, [employer]

## 2017-09-10 NOTE — Telephone Encounter (Signed)
Patient has been scheduled for 09/20/17.

## 2017-09-11 NOTE — Assessment & Plan Note (Signed)
Stable. Assymptomatic, denies chest pain or shortness of breath.

## 2017-09-11 NOTE — Assessment & Plan Note (Signed)
Stable. Symptoms controlled with Flonase 50 mcg- 1 spray each nostril Q Day and Zyrtec 5 mg po Q HS. Denies exacerbation.

## 2017-09-11 NOTE — Assessment & Plan Note (Signed)
Stable. No c/o recent exacerbations/ no report of diarrhea. Symptoms controlled with Questran 4 G daily prn for diarrhea.

## 2017-09-19 ENCOUNTER — Ambulatory Visit
Admission: RE | Admit: 2017-09-19 | Discharge: 2017-09-19 | Disposition: A | Discharge status: Home / Self Care | Payer: Medicare Other | Source: Ambulatory Visit | Attending: Neurology | Admitting: Neurology

## 2017-09-19 DIAGNOSIS — G319 Degenerative disease of nervous system, unspecified: Secondary | ICD-10-CM | POA: Diagnosis not present

## 2017-09-19 DIAGNOSIS — W19XXXA Unspecified fall, initial encounter: Secondary | ICD-10-CM | POA: Insufficient documentation

## 2017-09-19 DIAGNOSIS — S0990XA Unspecified injury of head, initial encounter: Secondary | ICD-10-CM | POA: Insufficient documentation

## 2017-09-19 DIAGNOSIS — R410 Disorientation, unspecified: Secondary | ICD-10-CM

## 2017-09-20 ENCOUNTER — Encounter: Payer: Self-pay | Admitting: Neurology

## 2017-09-20 ENCOUNTER — Ambulatory Visit (INDEPENDENT_AMBULATORY_CARE_PROVIDER_SITE_OTHER): Payer: Medicare Other | Admitting: Neurology

## 2017-09-20 ENCOUNTER — Telehealth: Payer: Self-pay | Admitting: Neurology

## 2017-09-20 VITALS — BP 105/66 | HR 69 | Ht 62.0 in | Wt 104.0 lb

## 2017-09-20 DIAGNOSIS — G301 Alzheimer's disease with late onset: Secondary | ICD-10-CM | POA: Diagnosis not present

## 2017-09-20 DIAGNOSIS — G459 Transient cerebral ischemic attack, unspecified: Secondary | ICD-10-CM | POA: Diagnosis not present

## 2017-09-20 DIAGNOSIS — F0281 Dementia in other diseases classified elsewhere with behavioral disturbance: Secondary | ICD-10-CM

## 2017-09-20 DIAGNOSIS — F05 Delirium due to known physiological condition: Secondary | ICD-10-CM | POA: Diagnosis not present

## 2017-09-20 DIAGNOSIS — F0390 Unspecified dementia without behavioral disturbance: Secondary | ICD-10-CM | POA: Diagnosis not present

## 2017-09-20 DIAGNOSIS — E785 Hyperlipidemia, unspecified: Secondary | ICD-10-CM | POA: Diagnosis not present

## 2017-09-20 DIAGNOSIS — I350 Nonrheumatic aortic (valve) stenosis: Secondary | ICD-10-CM | POA: Diagnosis not present

## 2017-09-20 DIAGNOSIS — I10 Essential (primary) hypertension: Secondary | ICD-10-CM | POA: Diagnosis not present

## 2017-09-20 NOTE — Patient Instructions (Signed)
Recommendations to prevent or slow progression of cognitive decline:   Exercise You should increase exercise 30 to 45 minutes per day at least 3 days a week although 5 to 7 would be preferred. Any type of exercise (including walking) is acceptable although a recumbent bicycle may be best if you are unsteady. Disease related apathy can be a significant roadblock to exercise and the only way to overcome this is to make it a daily routine and perhaps have a reward at the end (something your loved one loves to eat or drink perhaps) or a personal trainer coming to the home can also be very useful. In general a structured, repetitive schedule is best.   Cardiovascular Health: You should optimize all cardiovascular risk factors (blood pressure, sugar, cholesterol) as vascular disease such as strokes and heart attacks can make memory problems much worse.   Diet: Eating a heart healthy (Mediterranean) diet is also a good idea; fish and poultry instead of red meat, nuts (mostly non-peanuts), vegetables, fruits, olive oil or canola oil (instead of butter), minimal salt (use other spices to flavor foods), whole grain rice, bread, cereal and pasta and wine in moderation.  General Health: Any diseases which effect your body will effect your brain such as a pneumonia, urinary infection, blood clot, heart attack or stroke. Keep contact with your primary care doctor for regular follow ups.  Sleep. A good nights sleep is healthy for the brain. Seven hours is recommended. If you have insomnia or poor sleep habits see the recommendations below  Tips: Structured and consistent daytime and nighttime routine, including regular wake times, bedtimes, and mealtimes, will be important for the patient to avoid confusion. Keeping frequently used items in designated places will help reduce stress from searching. If there are worries about getting lost do not let the patient leave home unaccompanied. They might benefit from wearing an  identification bracelet that will help others assist in finding home if they become lost. Information about nationwide safe return services and other helpful resources may be obtained through the Alzheimer's Association helpline at 1800-541 162 6046.  Finances, Power of Producer, television/film/video Directives: You should consider putting legal safeguards in place with regard to financial and medical decision making. While the spouse always has power of attorney for medical and financial issues in the absence of any form, you should consider what you want in case the spouse / caregiver is no longer around or capable of making decisions.   [place] : http://www.welch.com/.pdf  Or Google "[place]" AND "An Forensic scientist for [employer]  Other States: ApartmentMom.com.ee  The signature on these forms should be notarized.   DRIVING:   Driving only during the day Drive only to familiar Locations Avoid driving during bad weather  If you would like to be tested to see if you are driving safely, [facility] has a Clinical Driving Evaluation. To schedule an appointment call [phone].                RESOURCES:  Memory Loss: Improve your short term memory By Silvio Pate  The Alzheimer's Reading Room http://www.alzheimersreadingroom.com/   The Alzheimer's Compendium http://www.alzcompend.info/  [employer] www.dukefamilysupport.K500091 336-109-8223  Recommended resources for caregivers (All can be purchased on [employer]):  1) A Caregiver's Guide to Dementia: Using Activities and Other Strategies to Prevent, Reduce and Manage Behavioral Symptoms by Osie Bond. [place] and [employer]   2) A Caregiver's Guide to [employer] Dementia by Caleen Essex MS BSN and Jeneen Rinks  [place]   3) What If It's Not Alzheimer's?: A Caregiver's Guide to  Dementia by Gary Radin (Author), Lisa Radin (Editor)  3) The 36 hour day by Rabins and Mace  4) Understanding Difficult Behaviors by Robinson, Spencer and White  Online course for helping caregivers reduce stress, guilt and frustration called the Caregivers Helpbook. The website is www.powerfultoolsforcaregivers.org  As a caregiver you are a problem-solver. Problems you face as a caregiver are usually unique to your situation and the way your loved-one's disease manifests itself. The best way to use these books is to look at the Table of Contents and read any chapters of interest or that apply to challenges you are having as a caregiver.  NATIONAL RESOURCES: For more information on neurological disorders or research programs funded by the National Institute of Neurological Disorders and Stroke, contact the Institute's Brain Resources and Information Network (BRAIN) at: BRAIN [address] [place] [address] 1-800-352-9424 (toll-free) www.ninds.nih.gov  Information on dementia is also available from the following organizations: Alzheimer's Disease Education and Referral (ADEAR) Center National Institute on Aging [address] [place] [address] 1-800-438-4380 (toll-free) www.nia.nih.gov/alzheimers  Alzheimer's Association [address] [place] [address] 1-800-272-3900 (toll-free, 24-hour helpline) 1-312-335-5886 (TDD) www.alz.org  Alzheimer's Foundation of America [address] [place] [address] 1-866-232-8484 (toll-free) www.alzfdn.org  Alzheimer's Drug Discovery Foundation [address], [address] [place] [address] 1-212-901-8000 www.alzdiscovery.org  Association for Frontotemporal Degeneration [place] [address] [place] [address] 1-866-507-7222 (toll-free) www.theaftd.org  [facility] [address] [place] [address] 1-800-437-2423  (toll-free) www.brightfocus.org/alzheimers  [facility] [address] [place] [address] 1-310-445-4650 www.jdfaf.org  Lewy Body Dementia Association [address], [place] [address] 1-404-935-6444 1-800-539-9767 (toll-free LBD Caregiver Link) www.lbda.org  National Institute of Mental Health [address] [place] [address] 1-866-415-8051 (toll-free) 1-301-443-8431 (TTY) www.nimh.nih.gov  National Organization for Rare Disorders [address] [place] [address] 1-800-999-NORD (1-800-999-6673) (toll-free) www.rarediseases.org  The Dementias: Hope Through Research was jointly produced by the National Institute of Neurological Disorders and Stroke (NINDS) and the National Institute on Aging (NIA), both part of the National Institutes of Health, the nation's medical research agency--supporting scientific studies that turn discovery into health. NINDS is the nation's leading funder of research on the brain and nervous system. The NINDS mission is to reduce the burden of neurological disease. For more information and resources, visit www.ninds.nih.gov [1] or call 1-800-352-9424. NIA leads the federal government effort conducting and supporting research on aging and the health and well-being of older people. NIA's Alzheimer's Disease Education and Referral (ADEAR) Center offers information and publications on dementia and caregiving for families, caregivers, and professionals. For more information, visit www.nia.nih.gov/alzheimers [2] or call 1-800-438-4380. Also available from NIA are publications and information about Alzheimer's disease as well as the booklets Frontotemporal Disorders: Information for Patients, Families, and Caregivers and Lewy Body Dementia: Information for Patients, Families, and Professionals. Source URL:  http://www.nia.nih.gov/alzheimers/publication/dementias/resources     

## 2017-09-20 NOTE — Telephone Encounter (Signed)
Called the patients daughter and shared the results. Pt is coming for her apt today and Dr Jaynee Eagles will review in more detail with them. Patients daughter verbalized understanding
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
## 2017-09-20 NOTE — Telephone Encounter (Signed)
-----   Message from Melvenia Beam, MD sent at 09/19/2017  8:02 PM EDT ----- CT of the head does not show anything acute, no bleeding or anything that looks like trauma from a fall. Stable. Talk to daughter, lets make an appointment with her and patient for one of the Fridays next month if possible thanks.

## 2017-09-21 ENCOUNTER — Encounter
Admission: RE | Admit: 2017-09-21 | Discharge: 2017-09-21 | Disposition: A | Discharge status: Home / Self Care | Payer: Medicare Other | Source: Ambulatory Visit | Attending: Internal Medicine | Admitting: Internal Medicine

## 2017-10-08 ENCOUNTER — Non-Acute Institutional Stay: Payer: Medicare Other | Admitting: Gerontology

## 2017-10-08 ENCOUNTER — Encounter: Payer: Self-pay | Admitting: Gerontology

## 2017-10-08 DIAGNOSIS — F0281 Dementia in other diseases classified elsewhere with behavioral disturbance: Secondary | ICD-10-CM

## 2017-10-08 DIAGNOSIS — K219 Gastro-esophageal reflux disease without esophagitis: Secondary | ICD-10-CM

## 2017-10-08 DIAGNOSIS — G309 Alzheimer's disease, unspecified: Secondary | ICD-10-CM

## 2017-10-08 DIAGNOSIS — G629 Polyneuropathy, unspecified: Secondary | ICD-10-CM

## 2017-10-10 ENCOUNTER — Encounter: Payer: Self-pay | Admitting: Internal Medicine

## 2017-10-21 ENCOUNTER — Encounter
Admission: RE | Admit: 2017-10-21 | Discharge: 2017-10-21 | Disposition: A | Discharge status: Home / Self Care | Payer: Medicare Other | Source: Ambulatory Visit | Attending: Internal Medicine | Admitting: Internal Medicine

## 2017-10-21 NOTE — Assessment & Plan Note (Signed)
Stable. Not on acid reducing medications.

## 2017-10-21 NOTE — Assessment & Plan Note (Signed)
Stable.  Aricept 10 mg p.o. nightly.  Pleasantly confused.  Very busy, frequently packing and unpacking her belongings and going through drawers

## 2017-10-21 NOTE — Assessment & Plan Note (Signed)
Stable.  No complaints of neuropathy or pain at this time.

## 2017-10-21 NOTE — Progress Notes (Signed)
Location:    Nursing Home Room Number: 347 Place of Service:  ALF [phone]) Provider:  Toni Arthurs, NP-C  [name], Maria Coder, [name]  Patient Care Team: Burnard Hawthorne, [name] as PCP - General (Family Medicine) Elsie Stain, MD (Pulmonary Disease)  Extended Emergency Contact Information Primary Emergency Contact: Bardin,Maria Bass Address: [address]          Maria Bass, Maria Bass Maria Bass Maria Bass of Maria Bass Mobile Phone: [phone] Relation: Daughter  Code Status: Full Goals of care: Advanced Directive information Advanced Directives 10/08/2017  Does Patient Have a Medical Advance Directive? Yes  Type of Advance Directive Living will  Does patient want to make changes to medical advance directive? No - Patient declined  Copy of [place] in Chart? -     Chief Complaint  Patient presents with  . Medical Management of Chronic Issues    Routine Visit    HPI:  Pt is a 84 y.o. female seen today for medical management of chronic diseases.    Esophageal reflux Stable. Not on acid reducing medications.   Alzheimer's dementia Stable.  Aricept 10 mg p.o. nightly.  Pleasantly confused.  Very busy, frequently packing and unpacking her belongings and going through drawers  Neuropathy Stable.  No complaints of neuropathy or pain at this time.  Please note pt with limited verbal/cognitive ability. Unable to obtain complete ROS. Some ROS info obtained from staff and documentation.   Past Medical History:  Diagnosis Date  . Anxiety   . Dementia   . Depression   . Hyperlipidemia   . Hypertension   . IBS (irritable bowel syndrome)    per pt- not dx as IBS, but has IBS "symptoms"  . MAI (mycobacterium avium-intracellulare) ([place]) 2003   treated at [facility]  . Skin cancer    Past Surgical History:  Procedure Laterality Date  . ABDOMINAL HYSTERECTOMY    . BLADDER REPAIR    . CATARACT EXTRACTION, BILATERAL    . TONSILLECTOMY      No Known  Allergies  Allergies as of 10/08/2017   No Known Allergies     Medication List        Accurate as of 10/08/17 11:59 PM. Always use your most recent med list.          acetaminophen 325 MG tablet Commonly known as:  TYLENOL Take 650 mg by mouth every 4 (four) hours as needed. for pain and/or increased temp. May be administered orally (via gastric tube if present), or rectally - if patient unable to take orally.   aspirin EC 325 MG tablet Take 325 mg by mouth daily.   cetirizine 10 MG tablet Commonly known as:  ZYRTEC Take 5 mg by mouth at bedtime.   Cholecalciferol 2000 units Caps Take 1 capsule by mouth daily.   cholestyramine 4 g packet Commonly known as:  QUESTRAN Take 4 g by mouth daily as needed.   COMPOUND W 40 % Pads Generic drug:  Salicylic Acid Apply 1 patch topically daily. Apply to calloused area on inside of Great Toe of Right Foot.   Cranberry 500 MG Caps Take 1 capsule by mouth daily.   cyanocobalamin 1000 MCG tablet Take 1,000 mcg by mouth daily.   donepezil 10 MG tablet Commonly known as:  ARICEPT Take 10 mg by mouth at bedtime.   ENSURE ENLIVE PO Take 1 Bottle by mouth 2 (two) times daily.   fluticasone 50 MCG/ACT nasal spray Commonly known as:  FLONASE Place 1 spray  into both nostrils daily.   Melatonin 5 MG Tabs Take 1 tablet by mouth at bedtime.   mirtazapine 45 MG tablet Commonly known as:  REMERON Take 45 mg by mouth at bedtime.   sertraline 50 MG tablet Commonly known as:  ZOLOFT Take 50 mg by mouth daily.       Review of Systems  Unable to perform ROS: Dementia  Constitutional: Negative for activity change, appetite change, chills, diaphoresis and fever.  HENT: Negative for congestion, mouth sores, nosebleeds, postnasal drip, sneezing, sore throat, trouble swallowing and voice change.   Respiratory: Negative for apnea, cough, choking, chest tightness, shortness of breath and wheezing.   Cardiovascular: Negative for chest  pain, palpitations and leg swelling.  Gastrointestinal: Negative for abdominal distention, abdominal pain, constipation, diarrhea and nausea.  Genitourinary: Negative for difficulty urinating, dysuria, frequency and urgency.  Musculoskeletal: Negative for back pain, gait problem and myalgias. Arthralgias: typical arthritis.  Skin: Negative for color change, pallor, rash and wound.  Neurological: Negative for dizziness, tremors, syncope, speech difficulty, weakness, numbness and headaches.  Psychiatric/Behavioral: Negative for agitation and behavioral problems.  All other systems reviewed and are negative.   Immunization History  Administered Date(s) Administered  . Influenza Whole 01/22/2011, 02/27/2012, 02/09/2013  . Influenza, High Dose Seasonal PF 01/10/2016  . Influenza,inj,quad, With Preservative 05/09/2017  . Influenza-Unspecified 01/19/2014, 01/07/2017, 05/09/2017  . Pneumococcal Conjugate-13 02/18/2014  . Pneumococcal Polysaccharide-23 04/02/2011  . Tdap 10/17/2010  . Zoster 08/29/2006   Pertinent  Health Maintenance Due  Topic Date Due  . INFLUENZA VACCINE  11/21/2017  . DEXA SCAN  Completed  . PNA vac Low Risk Adult  Completed   Fall Risk  02/06/2017 10/29/2016 08/13/2016 06/11/2016 04/10/2016  Falls in the past year? No No No No No  Risk for fall due to : Impaired balance/gait - - - -   Functional Status Survey:    Vitals:   10/08/17 1400  BP: (!) 128/58  Pulse: (!) 58  Resp: 16  Temp: (!) 97.2 F (36.2 C)  TempSrc: Oral  SpO2: 98%  Weight: 103 lb 6.4 oz (46.9 kg)  Height: 5\' 2"  (1.575 m)   Body mass index is 18.91 kg/m. Physical Exam  Constitutional: Vital signs are normal. She appears well-developed and well-nourished. She is active and cooperative. She does not appear ill. No distress.  HENT:  Head: Normocephalic and atraumatic.  Mouth/Throat: Uvula is midline, oropharynx is clear and moist and mucous membranes are normal. Mucous membranes are not pale,  not dry and not cyanotic.  Eyes: Pupils are equal, round, and reactive to light. Conjunctivae, EOM and lids are normal.  Neck: Trachea normal, normal range of motion and full passive range of motion without pain. Neck supple. No JVD present. No tracheal deviation, no edema and no erythema present. No thyromegaly present.  Cardiovascular: Normal rate, intact distal pulses and normal pulses. An irregular rhythm present. Exam reveals no gallop, no distant heart sounds and no friction rub.  Murmur heard. Pulses:      Dorsalis pedis pulses are 2+ on the right side, and 2+ on the left side.  No edema  Pulmonary/Chest: Effort normal and breath sounds normal. No accessory muscle usage. No respiratory distress. She has no decreased breath sounds. She has no wheezes. She has no rhonchi. She has no rales. She exhibits no tenderness.  Abdominal: Soft. Normal appearance and bowel sounds are normal. She exhibits no distension and no ascites. There is no tenderness.  Musculoskeletal: Normal range of motion.  She exhibits no edema or tenderness.  Expected osteoarthritis, stiffness; Bilateral Calves soft, supple. Negative Homan's Sign. B- pedal pulses equal; mobile with Rollator  Neurological: She is alert. She exhibits abnormal muscle tone. Coordination and gait abnormal.  Skin: Skin is warm, dry and intact. She is not diaphoretic. No cyanosis. No pallor. Nails show no clubbing.  Psychiatric: She has a normal mood and affect. Her speech is normal and behavior is normal. Thought content normal. Cognition and memory are impaired. She expresses impulsivity. She exhibits abnormal recent memory and abnormal remote memory.  Very pleasantly confused  Nursing note and vitals reviewed.   Labs reviewed: Recent Labs    08/15/17 0430 08/18/17 1152  NA 141 137  K 4.5 4.1  CL 106 101  CO2 30 30  GLUCOSE 91 103*  BUN 26* 25*  CREATININE 0.80 0.97  CALCIUM 8.7* 8.7*  MG 2.2  --    Recent Labs    11/29/16 0000  08/15/17 0430  AST  --  21  ALT  --  17  ALKPHOS  --  177*  BILITOT  --  0.5  PROT 6.5 6.1*  ALBUMIN  --  3.4*   Recent Labs    08/15/17 0430 08/18/17 1152  WBC 7.9 10.1  NEUTROABS 5.4  --   HGB 11.5* 11.8*  HCT 35.9 36.0  MCV 87.6 86.8  PLT 179 167   Lab Results  Component Value Date   TSH 0.712 08/15/2017   Lab Results  Component Value Date   HGBA1C 5.6 11/29/2016   Lab Results  Component Value Date   CHOL 171 08/15/2017   HDL 57 08/15/2017   LDLCALC 98 08/15/2017   LDLDIRECT 139.0 10/27/2012   TRIG 80 08/15/2017   CHOLHDL 3.0 08/15/2017    Significant Diagnostic Results in last 30 days:  No results found.  Assessment/Plan Lyn was seen today for medical management of chronic issues.  Diagnoses and all orders for this visit:  Gastroesophageal reflux disease, esophagitis presence not specified  Alzheimer's dementia with behavioral disturbance, unspecified timing of dementia onset  Neuropathy   Above listed condition stable  Continue current medication regimen  Redirect patient has appropriate  Continue to encourage patient to space and activities and interaction with other residents  Encourage patient to eat meals in the common area with other residents  Safety precautions  Fall precautions  Family/ staff Communication:   Total Time:  Documentation:  Face to Face:  Family/Phone:   Labs/tests ordered: Not due  Medication list reviewed and assessed for continued appropriateness. Monthly medication orders reviewed and signed.  Vikki Ports, NP-C [facility]iatrics [facility] [phone], [place] [address] Cell Phone (Mon-Fri 8am-5pm):  [phone] On Call:  [phone] & follow prompts after 5pm & weekends Office Phone:  [phone] Office Fax:  [phone]

## 2017-11-21 ENCOUNTER — Encounter
Admission: RE | Admit: 2017-11-21 | Discharge: 2017-11-21 | Disposition: A | Discharge status: Home / Self Care | Payer: Medicare Other | Source: Ambulatory Visit | Attending: Internal Medicine | Admitting: Internal Medicine

## 2017-11-27 ENCOUNTER — Ambulatory Visit: Payer: Medicare Other | Admitting: Nurse Practitioner

## 2017-12-02 ENCOUNTER — Encounter: Payer: Self-pay | Admitting: Adult Health

## 2017-12-02 ENCOUNTER — Non-Acute Institutional Stay: Payer: Medicare Other | Admitting: Adult Health

## 2017-12-02 DIAGNOSIS — K219 Gastro-esophageal reflux disease without esophagitis: Secondary | ICD-10-CM | POA: Diagnosis not present

## 2017-12-02 DIAGNOSIS — F0281 Dementia in other diseases classified elsewhere with behavioral disturbance: Secondary | ICD-10-CM

## 2017-12-02 DIAGNOSIS — I10 Essential (primary) hypertension: Secondary | ICD-10-CM

## 2017-12-02 DIAGNOSIS — G301 Alzheimer's disease with late onset: Secondary | ICD-10-CM | POA: Diagnosis not present

## 2017-12-02 DIAGNOSIS — F339 Major depressive disorder, recurrent, unspecified: Secondary | ICD-10-CM | POA: Diagnosis not present

## 2017-12-02 DIAGNOSIS — J309 Allergic rhinitis, unspecified: Secondary | ICD-10-CM

## 2017-12-02 DIAGNOSIS — I700 Atherosclerosis of aorta: Secondary | ICD-10-CM | POA: Diagnosis not present

## 2017-12-02 DIAGNOSIS — F02818 Dementia in other diseases classified elsewhere, unspecified severity, with other behavioral disturbance: Secondary | ICD-10-CM

## 2017-12-02 DIAGNOSIS — K580 Irritable bowel syndrome with diarrhea: Secondary | ICD-10-CM

## 2017-12-02 NOTE — Progress Notes (Signed)
Location:   [facility] [place] [facility] [place] Room Number: [phone] Place of Service:  ALF (13)   CODE STATUS: FULL  No Known Allergies  Chief Complaint  Patient presents with  . Medical Management of Chronic Issues    Hypertension; gerd; ibs.     HPI:  She is a 84 year old assisted living resident being seen for the management of her chronic illnesses: hypertension; gerd; ibs. There are no reports of heart burn; no diarrhea; no headaches. There are no nursing concerns at this time.   Past Medical History:  Diagnosis Date  . Anxiety   . Dementia   . Depression   . Hyperlipidemia   . Hypertension   . IBS (irritable bowel syndrome)    per pt- not dx as IBS, but has IBS "symptoms"  . MAI (mycobacterium avium-intracellulare) ([place]) 2003   treated at [facility]  . Skin cancer     Past Surgical History:  Procedure Laterality Date  . ABDOMINAL HYSTERECTOMY    . BLADDER REPAIR    . CATARACT EXTRACTION, BILATERAL    . TONSILLECTOMY      Social History   Socioeconomic History  . Marital status: Widowed    Spouse name: Not on file  . Number of children: 4  . Years of education: 75  . Highest education level: Not on file  Occupational History  . 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 Financial resource strain: Not on file  . Food insecurity:    Worry: Not on file    Inability: Not on file  . Transportation needs:    Medical: Not on file    Non-medical: Not on file  Tobacco Use  . Smoking status: Former Smoker    Types: Cigarettes    Last attempt to quit: 04/23/1978    Years since quitting: 39.6  . Smokeless tobacco: Never Used  . Tobacco comment: social smoker x 15  Substance and Sexual Activity  . Alcohol use: Yes    Alcohol/week: 7.0 standard drinks    Types: 7 Glasses of wine per week    Comment: glass a wine at night  . Drug use: No  . Sexual activity: Not Currently  Lifestyle  . Physical activity:    Days per week: Not on file   Minutes per session: Not on file  . Stress: Not on file  Relationships  . Social connections:    Talks on phone: Not on file    Gets together: Not on file    Attends religious service: Not on file    Active member of club or organization: Not on file    Attends meetings of clubs or organizations: Not on file    Relationship status: Not on file  . Intimate partner violence:    Fear of current or ex partner: Not on file    Emotionally abused: Not on file    Physically abused: Not on file    Forced sexual activity: Not on file  Other Topics Concern  . Not on file  Social History Narrative   Lives in [place], [place]   Widow   Right-handed   Caffeine: 2 cups coffe in the a.m.   Family History  Problem Relation Age of Onset  . Heart attack Mother   . Heart attack Father   . Diabetes Father   . Colon cancer Neg Hx   . Esophageal cancer Neg Hx   . Stomach cancer Neg  Hx       VITAL SIGNS BP (!) 130/58   Pulse 76   Temp (!) 96.6 F (35.9 C) (Oral)   Resp 18   Ht 5' 2"  (1.575 m)   Wt 112 lb 11.2 oz (51.1 kg)   SpO2 96%   BMI 20.61 kg/m   Outpatient Encounter Medications as of 12/02/2017  Medication Sig  . acetaminophen (TYLENOL) 325 MG tablet Take 650 mg by mouth every 4 (four) hours as needed. for pain and/or increased temp. May be administered orally (via gastric tube if present), or rectally - if patient unable to take orally.  [name] aspirin EC 325 MG tablet Take 325 mg by mouth daily.   . cetirizine (ZYRTEC) 10 MG tablet Take 5 mg by mouth at bedtime.  . Cholecalciferol 2000 units CAPS Take 1 capsule by mouth daily.   . cholestyramine (QUESTRAN) 4 g packet Take 4 g by mouth daily as needed.  . Cranberry 500 MG CAPS Take 1 capsule by mouth daily.  . cyanocobalamin 1000 MCG tablet Take 1,000 mcg by mouth daily.  [name] donepezil (ARICEPT) 10 MG tablet Take 10 mg by mouth at bedtime.  . fluticasone (FLONASE) 50 MCG/ACT nasal spray Place 1 spray into both  nostrils daily.  . Melatonin 5 MG TABS Take 1 tablet by mouth at bedtime.  . mirtazapine (REMERON) 45 MG tablet Take 45 mg by mouth at bedtime.  . Nutritional Supplements (ENSURE ENLIVE PO) Take 1 Bottle by mouth 2 (two) times daily.  . Salicylic Acid (COMPOUND W) 40 % PADS Apply 1 patch topically daily. Apply to calloused area on inside of Great Toe of Right Foot.  . sertraline (ZOLOFT) 50 MG tablet Take 50 mg by mouth daily.   No facility-administered encounter medications on file as of 12/02/2017.      SIGNIFICANT DIAGNOSTIC EXAMS  LABS REVIEWED TODAY:   08-15-17: wbc 7.9; hgb 11.5; hct 35.9; mcv 87.6; plt 179; glucose 91; bun 26; creat 0.80; k+ 4.5; na++ 141; ca 8.7; alk phos 177; albumin 3.4 mag 2.2; chol 171; ldl 98; trig 80; hdl 57; tsh 0.712; vit B 12: 339; vit D 27.1  Review of Systems  Unable to perform ROS: Dementia (unable to participate )   Physical Exam  Constitutional: No distress.  Frail   Neck: No thyromegaly present.  Cardiovascular: Normal rate and intact distal pulses.  Murmur heard. 1/6 Heart rate irregular   Pulmonary/Chest: Effort normal and breath sounds normal. No respiratory distress.  Abdominal: Soft. Bowel sounds are normal. She exhibits no distension. There is no tenderness.  Musculoskeletal: Normal range of motion. She exhibits no edema.  Lymphadenopathy:    She has no cervical adenopathy.  Neurological: She is alert.  Skin: Skin is warm and dry. She is not diaphoretic.  Psychiatric: She has a normal mood and affect.     ASSESSMENT/ PLAN:  TODAY;   1. Essential hypertension, benign: stable b/p 130/58: will continue asa 325 mg daily   2. Atherosclerosis of aorta: is stable will continue asa 235 mg daily   3. Esophageal reflux without esophagitis: is stable will continue to monitor   4. Irritable bowel syndrome with diarrhea: is stable will continue questran 4 gm daily as needed  5. Alzheimer's dementia late onset with behavioral  disturbance: is without change weight is 112 pounds; will continue aricept 10 mg daily   6. Chronic allergic rhinitis: stable will continue zyrtec 5 mg daily and flonase daily   7. Major depression, recurrent,  chronic: is stable will continue zolot 50 mg daily remeron 45 mg nightly and takes melatonin 5 mg nightly for sleep        MD is aware of resident's narcotic use and is in agreement with current plan of care. We will attempt to wean resident as apropriate   Ok Edwards NP [facility]  Contact [phone] Monday through Friday 8am- 5pm  After hours call [phone]

## 2017-12-12 DIAGNOSIS — F339 Major depressive disorder, recurrent, unspecified: Secondary | ICD-10-CM | POA: Insufficient documentation

## 2017-12-12 DIAGNOSIS — I10 Essential (primary) hypertension: Secondary | ICD-10-CM | POA: Insufficient documentation

## 2017-12-21 ENCOUNTER — Emergency Department: Payer: Medicare Other

## 2017-12-21 ENCOUNTER — Emergency Department
Admission: EM | Admit: 2017-12-21 | Discharge: 2017-12-22 | Disposition: E | Payer: Medicare Other | Attending: Family Medicine | Admitting: Family Medicine

## 2017-12-21 DIAGNOSIS — Z87891 Personal history of nicotine dependence: Secondary | ICD-10-CM | POA: Insufficient documentation

## 2017-12-21 DIAGNOSIS — R404 Transient alteration of awareness: Secondary | ICD-10-CM | POA: Diagnosis present

## 2017-12-21 DIAGNOSIS — Z85828 Personal history of other malignant neoplasm of skin: Secondary | ICD-10-CM | POA: Insufficient documentation

## 2017-12-21 DIAGNOSIS — I10 Essential (primary) hypertension: Secondary | ICD-10-CM | POA: Diagnosis not present

## 2017-12-21 DIAGNOSIS — F0280 Dementia in other diseases classified elsewhere without behavioral disturbance: Secondary | ICD-10-CM | POA: Diagnosis not present

## 2017-12-21 DIAGNOSIS — G309 Alzheimer's disease, unspecified: Secondary | ICD-10-CM | POA: Insufficient documentation

## 2017-12-21 DIAGNOSIS — I615 Nontraumatic intracerebral hemorrhage, intraventricular: Secondary | ICD-10-CM | POA: Insufficient documentation

## 2017-12-21 DIAGNOSIS — Z7982 Long term (current) use of aspirin: Secondary | ICD-10-CM | POA: Diagnosis not present

## 2017-12-21 DIAGNOSIS — Z79899 Other long term (current) drug therapy: Secondary | ICD-10-CM | POA: Insufficient documentation

## 2017-12-21 DIAGNOSIS — I619 Nontraumatic intracerebral hemorrhage, unspecified: Secondary | ICD-10-CM | POA: Diagnosis present

## 2017-12-21 DIAGNOSIS — R4020 Unspecified coma: Secondary | ICD-10-CM | POA: Diagnosis not present

## 2017-12-21 LAB — BASIC METABOLIC PANEL
Anion gap: 9 (ref 5–15)
BUN: 20 mg/dL (ref 8–23)
CALCIUM: 8.9 mg/dL (ref 8.9–10.3)
CHLORIDE: 107 mmol/L (ref 98–111)
CO2: 28 mmol/L (ref 22–32)
CREATININE: 0.79 mg/dL (ref 0.44–1.00)
GFR calc non Af Amer: >60 mL/min (ref >60)
Glucose, Bld: 128 mg/dL — ABNORMAL HIGH (ref 70–99)
Potassium: 4.2 mmol/L (ref 3.5–5.1)
SODIUM: 144 mmol/L (ref 135–145)

## 2017-12-21 LAB — HEPATIC FUNCTION PANEL
ALT: 19 U/L (ref 0–44)
AST: 37 U/L (ref 15–41)
Albumin: 3.7 g/dL (ref 3.5–5.0)
Alkaline Phosphatase: 132 U/L — ABNORMAL HIGH (ref 38–126)
TOTAL PROTEIN: 6.9 g/dL (ref 6.5–8.1)
Total Bilirubin: 0.5 mg/dL (ref 0.3–1.2)

## 2017-12-21 LAB — CBC
HEMATOCRIT: 38.6 % (ref 35.0–47.0)
HEMOGLOBIN: 12.6 g/dL (ref 12.0–16.0)
MCH: 28.1 pg (ref 26.0–34.0)
MCHC: 32.6 g/dL (ref 32.0–36.0)
MCV: 86 fL (ref 80.0–100.0)
Platelets: 189 10*3/uL (ref 150–440)
RBC: 4.49 MIL/uL (ref 3.80–5.20)
RDW: 14.9 % — AB (ref 11.5–14.5)
WBC: 19.9 10*3/uL — ABNORMAL HIGH (ref 3.6–11.0)

## 2017-12-21 LAB — TROPONIN I: Troponin I: 0.05 ng/mL (ref <0.03)

## 2017-12-21 MED ORDER — SUCCINYLCHOLINE CHLORIDE 20 MG/ML IJ SOLN
INTRAMUSCULAR | Status: DC | PRN
  Administered 2017-12-21: 100 mg via INTRAVENOUS

## 2017-12-21 MED ORDER — MIDAZOLAM HCL 5 MG/5ML IJ SOLN
INTRAMUSCULAR | Status: DC | PRN
  Administered 2017-12-21: 1 mg via INTRAVENOUS

## 2017-12-22 ENCOUNTER — Encounter: Admission: RE | Admit: 2017-12-22 | Payer: Medicare Other | Source: Ambulatory Visit | Admitting: Internal Medicine

## 2017-12-22 NOTE — ED Notes (Signed)
Time of death pronounced by [facility] MD.

## 2017-12-22 NOTE — ED Triage Notes (Signed)
Pt presents via EMS from [place]. Pt unresponsive to painful stimuli. Found this am in current condition by staff. Agonal aspirations noted, Malinda MD at bedside. Pt placed on code cart.

## 2017-12-22 NOTE — ED Notes (Signed)
Called daughter Mardene Celeste) to report to ED due to Mother's critical condition. Reports on the way to ED.

## 2017-12-22 NOTE — ED Notes (Signed)
[facility] called for transport per Cassie RN. Pt in body bag. RN supervisor to call daughter for funeral home arrangements.

## 2017-12-22 NOTE — Progress Notes (Signed)
Family Meeting Note  Advance Directive:yes  Today a meeting took place with the Family/daughter.  Patient is unable to participate due ZO:[id number] capacity Comatose   The following clinical team members were present during this meeting:MD  The following were discussed:Patient's diagnosis: ICH, Patient's progosis: Unable to determine and Goals for treatment: DNR  Additional follow-up to be provided: prn  Time spent during discussion:20 minutes  Gorden Harms, MD

## 2017-12-22 NOTE — ED Notes (Signed)
Comfort care initiated per MD and family discussion. Pt made DNR by family.

## 2017-12-22 NOTE — ED Provider Notes (Addendum)
[facility] Emergency Department Provider Note   ____________________________________________   First MD Initiated Contact with Patient 07-Jan-2018 1130     (approximate)  I have reviewed the triage vital signs and the nursing notes.   HISTORY  Chief Complaint Loss of Consciousness History limited by unresponsiveness  HPI Maria Bass is a 84 y.o. female who was last seen well last night.  She is found unresponsive this morning.  Usually she is reported to be up and around by 8:00 today 1030 she still was not up she was brought to the emergency room.  She is not responsive to deep pain.  She is having episodes of hypoxemia where she only breathe for 5 times a minute but then she will pick up to 12 the 15 times a minute.  It does not really appear to be Cheyne-Stokes however.  Patient does not have a gag reflex.  She will be intubated to protect her airway.   Past Medical History:  Diagnosis Date  . Anxiety   . Dementia   . Depression   . Hyperlipidemia   . Hypertension   . IBS (irritable bowel syndrome)    per pt- not dx as IBS, but has IBS "symptoms"  . MAI (mycobacterium avium-intracellulare) ([place]) 2003   treated at [facility]  . Skin cancer     Patient Active Problem List   Diagnosis Date Noted  . Essential hypertension, benign 12/12/2017  . Major depression, recurrent, chronic ([place]) 12/12/2017  . Pre-ulcerative corn or callous 08/13/2017  . GAD (generalized anxiety disorder) 02/13/2017  . Neuropathy 10/29/2016  . Insomnia 10/29/2016  . Atherosclerosis of aorta ([place]) 09/25/2016  . Abnormal chest x-ray with multiple lung nodules 07/09/2016  . Aortic valve stenosis 07/30/2014  . Esophageal reflux 02/18/2014  . Stool incontinence 02/18/2014  . Alzheimer's dementia 10/27/2012  . Chronic allergic rhinitis 11/28/2011  . Female bladder prolapse 10/30/2011  . Irritable bowel syndrome with diarrhea 10/30/2011  . Heart murmur 05/04/2011  .  Hyperlipidemia 12/26/2010  . Depression 12/26/2010    Past Surgical History:  Procedure Laterality Date  . ABDOMINAL HYSTERECTOMY    . BLADDER REPAIR    . CATARACT EXTRACTION, BILATERAL    . TONSILLECTOMY      Prior to Admission medications   Medication Sig Start Date End Date Taking? Authorizing Provider  acetaminophen (TYLENOL) 325 MG tablet Take 650 mg by mouth every 4 (four) hours as needed. for pain and/or increased temp. May be administered orally (via gastric tube if present), or rectally - if patient unable to take orally.    [provider]  aspirin EC 325 MG tablet Take 325 mg by mouth daily.     [provider]  cetirizine (ZYRTEC) 10 MG tablet Take 5 mg by mouth at bedtime.    [provider]  Cholecalciferol 2000 units CAPS Take 1 capsule by mouth daily.     [provider]  cholestyramine (QUESTRAN) 4 g packet Take 4 g by mouth daily as needed.    [provider]  Cranberry 500 MG CAPS Take 1 capsule by mouth daily.    [provider]  cyanocobalamin 1000 MCG tablet Take 1,000 mcg by mouth daily.    [provider]  donepezil (ARICEPT) 10 MG tablet Take 10 mg by mouth at bedtime.    [provider]  fluticasone (FLONASE) 50 MCG/ACT nasal spray Place 1 spray into both nostrils daily.    [provider]  Melatonin 5  MG TABS Take 1 tablet by mouth at bedtime.    [provider]  mirtazapine (REMERON) 45 MG tablet Take 45 mg by mouth at bedtime.    [provider]  Nutritional Supplements (ENSURE ENLIVE PO) Take 1 Bottle by mouth 2 (two) times daily.    [provider]  Salicylic Acid (COMPOUND W) 40 % PADS Apply 1 patch topically daily. Apply to calloused area on inside of Great Toe of Right Foot.    [provider]  sertraline (ZOLOFT) 50 MG tablet Take 50 mg by mouth daily.    [provider]    Allergies Patient has no known allergies.  Family  History  Problem Relation Age of Onset  . Heart attack Mother   . Heart attack Father   . Diabetes Father   . Colon cancer Neg Hx   . Esophageal cancer Neg Hx   . Stomach cancer Neg Hx     Social History Social History   Tobacco Use  . Smoking status: Former Smoker    Types: Cigarettes    Last attempt to quit: 04/23/1978    Years since quitting: 39.6  . Smokeless tobacco: Never Used  . Tobacco comment: social smoker x 15  Substance Use Topics  . Alcohol use: Yes    Alcohol/week: 7.0 standard drinks    Types: 7 Glasses of wine per week    Comment: glass a wine at night  . Drug use: No    Review of Systems Unable to obtain  ____________________________________________   PHYSICAL EXAM:  VITAL SIGNS: ED Triage Vitals [12/25/2017 1132]  Enc Vitals Group     BP      Pulse      Resp      Temp      Temp src      SpO2      Weight 124 lb 9 oz (56.5 kg)     Height      Head Circumference      Peak Flow      Pain Score      Pain Loc      Pain Edu?      Excl. in [place]?     Constitutional: Unresponsive even to deep pain Eyes: Conjunctivae are normal.  Pulls equal and round do not appear to respond well to light Head: Atraumatic. Nose: No congestion/rhinnorhea. Mouth/Throat: Mucous membranes are moist.  Oropharynx non-erythematous. Neck: No stridor.  Cardiovascular: Normal rate, regular rhythm. Grossly normal heart sounds.  Good peripheral circulation. Respiratory: Normal respiratory effort.  No retractions. Lungs  CTAB. Gastrointestinal: Soft and nontender. No distention. No abdominal bruits. No CVA tenderness. Musculoskeletal: No lower extremity tenderness nor edema.  No joint effusions. Neurologic: Patient is flaccid with snoring respirations. Skin:  Skin is warm, dry and intact. No rash noted.   ____________________________________________   LABS (all labs ordered are listed, but only abnormal results are displayed)  Labs Reviewed  CBC - Abnormal; Notable for  the following components:      Result Value   WBC 19.9 (*)    RDW 14.9 (*)    All other components within normal limits  BASIC METABOLIC PANEL - Abnormal; Notable for the following components:   Glucose, Bld 128 (*)    All other components within normal limits  HEPATIC FUNCTION PANEL - Abnormal; Notable for the following components:   Alkaline Phosphatase 132 (*)    All other components within normal limits  TROPONIN I   ____________________________________________  EKG  ____________________________________________  RADIOLOGY  ED MD interpretation:   Official radiology report(s): Ct Head Wo Contrast  Result Date: 2018/01/20 CLINICAL DATA:  84 year old female found unresponsive EXAM: CT HEAD WITHOUT CONTRAST TECHNIQUE: Contiguous axial images were obtained from the base of the skull through the vertex without intravenous contrast. COMPARISON:  Prior head CT 09/19/2017 FINDINGS: Brain: Positive for massive intracranial hemorrhage with the epicenter in the left frontal lobe. There is large volume intraparenchymal hemorrhage, intraventricular hemorrhage and a small amount of subarachnoid hemorrhage. Approximately 2.3 cm left to right midline shift. Cerebral edema diffusely. Complete effacement of the perimesencephalic cisterns and slight crowding of the cerebral tonsils concerning for impending herniation. Intraventricular hemorrhage is present passing through the fourth ventricle. Vascular: No hyperdense vessel or unexpected calcification. Skull: Normal. Negative for fracture or focal lesion. Sinuses/Orbits: No acute finding. Other: None. IMPRESSION: Positive for catastrophic intracranial hemorrhage (6 x 5.6 x 9.6 cm (volume = 200 cm^3)) with the epicenter in the region of the left basal ganglia. There is severe left-to-right midline shift of approximately 2.3 cm and blood throughout the ventricular system. Diffuse cerebral edema with entrapment of the temporal horn of the right ventricular  system and crowding of the cerebellar tonsils suggesting impending herniation. Critical Value/emergent results were called by telephone at the time of interpretation on 2018/01/20 at 12:23 pm to Dr. Conni Slipper , who verbally acknowledged these results. Electronically Signed   By: Jacqulynn Cadet M.D.   On: Jan 20, 2018 12:23   Dg Chest Portable 1 View  Result Date: 2018-01-20 CLINICAL DATA:  Patient unresponsive.  Status post intubation today. EXAM: PORTABLE CHEST 1 VIEW COMPARISON:  CT chest 07/03/2016 chest 08/18/2017. FINDINGS: Endotracheal tube is in place with the tip in good position at the level of clavicular heads. NG tube courses into the stomach and below the inferior margin of the film. Scattered areas of mild bronchiectasis and nodularity are seen most consistent with chronic MAI infection as described on report of prior CT. No new airspace disease. Heart size is normal. Aortic atherosclerosis noted. No pneumothorax or pleural effusion. IMPRESSION: ETT and NG tube in good position.  No acute disease. Electronically Signed   By: Inge Rise M.D.   On: 01-20-2018 12:18    ____________________________________________   PROCEDURES  Procedure(s) performed: Patient given 1 mg of Versed and then 100 of socks patient intubated using glide scope with #7 ETT good bilateral breath sounds and color change  Procedures  Critical Care performed:  Critical care time 1/2-hour this includes evaluating the patient the intubation discussing the patient with daughter the radiologist and the hospitalist. ____________________________________________   [place] / [place] / ED COURSE  Radiologist calls and reports nonsurvivable intracranial hemorrhage.  Daughter is here and aware of the large hemorrhagic stroke.  She request the patient be made no code.  I agree with this completely.  Patient will be comfort care only.  Patient is extubated at the daughter's request.  OG tube is  removed at the same time.         ____________________________________________   FINAL CLINICAL IMPRESSION(S) / ED DIAGNOSES  Final diagnoses:  Nontraumatic intraventricular intracerebral hemorrhage, unspecified laterality [facility])     ED Discharge Orders    None       Note:  This document was prepared using Dragon voice recognition software and may include unintentional dictation errors.    Nena Polio, MD 2018-01-20 1238    Nena Polio, MD 20-Jan-2018 1311 ----------------------------------------- 1:32 PM on  2017/12/30 -----------------------------------------  Patient has died.  She has no pulse or respirations pupils are fixed and dilated   Nena Polio, MD 2017/12/30 1332

## 2017-12-22 NOTE — H&P (Signed)
[facility] [place] NAME: Maria Bass    MR#:  [id number]  DATE OF BIRTH:  Oct 28, 1929  DATE OF ADMISSION:  01-11-18  PRIMARY CARE PHYSICIAN: Kirk Ruths, MD   REQUESTING/REFERRING PHYSICIAN:   CHIEF COMPLAINT:   Chief Complaint  Patient presents with  . Loss of Consciousness    HISTORY OF PRESENT ILLNESS: Maria Bass  is a 84 y.o. female with a known history per below presenting from home with acute unresponsiveness, found to be in acute respiratory failure, status post intubation in the emergency room, underwent head CT-noted to have unsurvivable intracranial hemorrhage, case discussed with ED attending and family, patient was extubated, family request hospice with comfort care management going forward with expectant management.  PAST MEDICAL HISTORY:   Past Medical History:  Diagnosis Date  . Anxiety   . Dementia   . Depression   . Hyperlipidemia   . Hypertension   . IBS (irritable bowel syndrome)    per pt- not dx as IBS, but has IBS "symptoms"  . MAI (mycobacterium avium-intracellulare) ([place]) 2003   treated at [facility]  . Skin cancer     PAST SURGICAL HISTORY:  Past Surgical History:  Procedure Laterality Date  . ABDOMINAL HYSTERECTOMY    . BLADDER REPAIR    . CATARACT EXTRACTION, BILATERAL    . TONSILLECTOMY      SOCIAL HISTORY:  Social History   Tobacco Use  . Smoking status: Former Smoker    Types: Cigarettes    Last attempt to quit: 04/23/1978    Years since quitting: 39.6  . Smokeless tobacco: Never Used  . Tobacco comment: social smoker x 15  Substance Use Topics  . Alcohol use: Yes    Alcohol/week: 7.0 standard drinks    Types: 7 Glasses of wine per week    Comment: glass a wine at night    FAMILY HISTORY:  Family History  Problem Relation Age of Onset  . Heart attack Mother   . Heart attack Father   . Diabetes Father   . Colon cancer Neg Hx   . Esophageal cancer Neg Hx   . Stomach  cancer Neg Hx     DRUG ALLERGIES: No Known Allergies  REVIEW OF SYSTEMS: Unable to be obtain due to comatose state  CONSTITUTIONAL: No fever, fatigue or weakness.  EYES: No blurred or double vision.  EARS, NOSE, AND THROAT: No tinnitus or ear pain.  RESPIRATORY: No cough, shortness of breath, wheezing or hemoptysis.  CARDIOVASCULAR: No chest pain, orthopnea, edema.  GASTROINTESTINAL: No nausea, vomiting, diarrhea or abdominal pain.  GENITOURINARY: No dysuria, hematuria.  ENDOCRINE: No polyuria, nocturia,  HEMATOLOGY: No anemia, easy bruising or bleeding SKIN: No rash or lesion. MUSCULOSKELETAL: No joint pain or arthritis.   NEUROLOGIC: No tingling, numbness, weakness.  PSYCHIATRY: No anxiety or depression.   MEDICATIONS AT HOME:  Prior to Admission medications   Medication Sig Start Date End Date Taking? Authorizing Provider  acetaminophen (TYLENOL) 325 MG tablet Take 650 mg by mouth every 4 (four) hours as needed. for pain and/or increased temp. May be administered orally (via gastric tube if present), or rectally - if patient unable to take orally.    [provider]  aspirin EC 325 MG tablet Take 325 mg by mouth daily.     [provider]  cetirizine (ZYRTEC) 10 MG tablet Take 5 mg by mouth at bedtime.    [provider]  Cholecalciferol 2000 units CAPS  Take 1 capsule by mouth daily.     [provider]  cholestyramine (QUESTRAN) 4 g packet Take 4 g by mouth daily as needed.    [provider]  Cranberry 500 MG CAPS Take 1 capsule by mouth daily.    [provider]  cyanocobalamin 1000 MCG tablet Take 1,000 mcg by mouth daily.    [provider]  donepezil (ARICEPT) 10 MG tablet Take 10 mg by mouth at bedtime.    [provider]  fluticasone (FLONASE) 50 MCG/ACT nasal spray Place 1 spray into both nostrils daily.    [provider]  Melatonin 5 MG TABS Take 1 tablet by mouth at bedtime.    [provider]  mirtazapine (REMERON) 45 MG tablet Take 45 mg by mouth at bedtime.    [provider]  Nutritional Supplements (ENSURE ENLIVE PO) Take 1 Bottle by mouth 2 (two) times daily.    [provider]  Salicylic Acid (COMPOUND W) 40 % PADS Apply 1 patch topically daily. Apply to calloused area on inside of Great Toe of Right Foot.    [provider]  sertraline (ZOLOFT) 50 MG tablet Take 50 mg by mouth daily.    [provider]      PHYSICAL EXAMINATION:   VITAL SIGNS: Blood pressure 128/61, pulse 61, resp. rate (!) 22, weight 56.5 kg, SpO2 99 %.  GENERAL:  84 y.o.-year-old patient lying in the bed with no acute distress.  Well-appearing EYES: Pupils equal, round, reactive to light and accommodation. No scleral icterus. Extraocular muscles intact.  HEENT: Head atraumatic, normocephalic. Oropharynx and nasopharynx clear.  NECK:  Supple, no jugular venous distention. No thyroid enlargement, no tenderness.  LUNGS: Normal breath sounds bilaterally, no wheezing, rales,rhonchi or crepitation. No use of accessory muscles of respiration.  CARDIOVASCULAR: S1, S2 normal. No murmurs, rubs, or gallops.  ABDOMEN: Soft, nontender, nondistended. Bowel sounds present. No organomegaly or mass.  EXTREMITIES: No pedal edema, cyanosis, or clubbing.  NEUROLOGIC: Comatose, minimal response to painful stimuli PSYCHIATRIC: The patient is comatose  SKIN: No obvious rash, lesion, or ulcer.   LABORATORY PANEL:   CBC Recent Labs  Lab 01-11-2018 1121  WBC 19.9*  HGB 12.6  HCT 38.6  PLT 189  MCV 86.0  MCH 28.1  MCHC 32.6  RDW 14.9*   ------------------------------------------------------------------------------------------------------------------  Chemistries  Recent Labs  Lab 2018-01-11 1121  NA 144  K 4.2  CL 107  CO2 28  GLUCOSE 128*  BUN 20  CREATININE 0.79  CALCIUM 8.9  AST 37  ALT 19  ALKPHOS 132*  BILITOT 0.5    ------------------------------------------------------------------------------------------------------------------ estimated creatinine clearance is 39.9 mL/min (by C-G formula based on SCr of 0.79 mg/dL). ------------------------------------------------------------------------------------------------------------------ No results for input(s): TSH, T4TOTAL, T3FREE, THYROIDAB in the last 72 hours.  Invalid input(s): FREET3   Coagulation profile No results for input(s): INR, PROTIME in the last 168 hours. ------------------------------------------------------------------------------------------------------------------- No results for input(s): DDIMER in the last 72 hours. -------------------------------------------------------------------------------------------------------------------  Cardiac Enzymes Recent Labs  Lab January 11, 2018 1121  TROPONINI 0.05*   ------------------------------------------------------------------------------------------------------------------ Invalid input(s): POCBNP  ---------------------------------------------------------------------------------------------------------------  Urinalysis    Component Value Date/Time   COLORURINE YELLOW (A) 08/18/2017 1400   APPEARANCEUR CLOUDY (A) 08/18/2017 1400   LABSPEC 1.013 08/18/2017 1400   PHURINE 5.0 08/18/2017 1400   GLUCOSEU NEGATIVE 08/18/2017 1400   GLUCOSEU NEGATIVE 01/30/2017 1358   HGBUR NEGATIVE 08/18/2017 1400   BILIRUBINUR NEGATIVE 08/18/2017 1400   BILIRUBINUR Negative 08/13/2016 1643   [place] 08/18/2017  1400   PROTEINUR 30 (A) 08/18/2017 1400   UROBILINOGEN 0.2 01/30/2017 1358   NITRITE POSITIVE (A) 08/18/2017 1400   LEUKOCYTESUR LARGE (A) 08/18/2017 1400     RADIOLOGY: Ct Head Wo Contrast  Result Date: 30-Dec-2017 CLINICAL DATA:  84 year old female found unresponsive EXAM: CT HEAD WITHOUT CONTRAST TECHNIQUE: Contiguous axial images were obtained from the base of the skull  through the vertex without intravenous contrast. COMPARISON:  Prior head CT 09/19/2017 FINDINGS: Brain: Positive for massive intracranial hemorrhage with the epicenter in the left frontal lobe. There is large volume intraparenchymal hemorrhage, intraventricular hemorrhage and a small amount of subarachnoid hemorrhage. Approximately 2.3 cm left to right midline shift. Cerebral edema diffusely. Complete effacement of the perimesencephalic cisterns and slight crowding of the cerebral tonsils concerning for impending herniation. Intraventricular hemorrhage is present passing through the fourth ventricle. Vascular: No hyperdense vessel or unexpected calcification. Skull: Normal. Negative for fracture or focal lesion. Sinuses/Orbits: No acute finding. Other: None. IMPRESSION: Positive for catastrophic intracranial hemorrhage (6 x 5.6 x 9.6 cm (volume = 200 cm^3)) with the epicenter in the region of the left basal ganglia. There is severe left-to-right midline shift of approximately 2.3 cm and blood throughout the ventricular system. Diffuse cerebral edema with entrapment of the temporal horn of the right ventricular system and crowding of the cerebellar tonsils suggesting impending herniation. Critical Value/emergent results were called by telephone at the time of interpretation on 2017/12/30 at 12:23 pm to Dr. Conni Slipper , who verbally acknowledged these results. Electronically Signed   By: Jacqulynn Cadet M.D.   On: 2017-12-30 12:23   Dg Chest Portable 1 View  Result Date: Dec 30, 2017 CLINICAL DATA:  Patient unresponsive.  Status post intubation today. EXAM: PORTABLE CHEST 1 VIEW COMPARISON:  CT chest 07/03/2016 chest 08/18/2017. FINDINGS: Endotracheal tube is in place with the tip in good position at the level of clavicular heads. NG tube courses into the stomach and below the inferior margin of the film. Scattered areas of mild bronchiectasis and nodularity are seen most consistent with chronic MAI infection as  described on report of prior CT. No new airspace disease. Heart size is normal. Aortic atherosclerosis noted. No pneumothorax or pleural effusion. IMPRESSION: ETT and NG tube in good position.  No acute disease. Electronically Signed   By: Inge Rise M.D.   On: 2017/12/30 12:18    EKG: Orders placed or performed during the hospital encounter of 08/18/17  . ED EKG  . ED EKG  . EKG 12-Lead  . EKG 12-Lead    IMPRESSION AND PLAN: *Acute intracranial hemorrhage *Acute comatose state secondary to above *Acute hypoxic respiratory failure  Admit to regular nursing for bed on our comfort care order set with expectant management   All the records are reviewed and case discussed with ED provider. Management plans discussed with the patient, family and they are in agreement.  CODE STATUS:dnr    TOTAL TIME TAKING CARE OF THIS PATIENT: 45 minutes.    Avel Peace Sheily Lineman M.D on 12-30-17   Between 7am to 6pm - Pager - [phone]  After 6pm go to [email or web address] - password [email or web address] [facility]  Office  [phone]  CC: Primary care physician; Kirk Ruths, MD   Note: This dictation was prepared with Dragon dictation along with smaller phrase technology. Any transcriptional errors that result from this process are unintentional.

## 2017-12-22 NOTE — Progress Notes (Signed)
(  [facility]) was paged from the ED, to provide spiritual care/grief and emotional support for the family due to the passing of Mrs. Lacap. I arrived at the bedside, and reportedly the daughter just left, I entered the hallway and caught up with the daughter, pastoral presence ensued, we spoke briefly, and I offered words of comfort, in addition to asking is she wanted prayer. The daughter accepted the opportunity for prayer and we proceeded back to the room of the recently expired Mrs. Rosiland Oz. We held hands and the [facility]) prayed for comfort for family and friends, we celebrated her passing and shared words of exaltation pursuant with God. After prayer commenced the daughter reflected upon a couple good memories of her mom and was comforted by the fact that she didn't suffer. Salutations were bestowed upon the daughter prior to her leaving, and the [facility]) escorted her to the ED exit.     01/19/18 1400  Clinical Encounter Type  Visited With Patient and family together  Visit Type Initial  Referral From Physician  Consult/Referral To Chaplain  Spiritual Encounters  Spiritual Needs Prayer;Emotional;Grief support  Stress Factors  Patient Stress Factors Major life changes  Family Stress Factors Major life changes

## 2017-12-22 NOTE — Progress Notes (Signed)
One way extubation complete. Pt on room with sats in mid 90's

## 2017-12-22 NOTE — ED Notes (Signed)
CXR at bedside

## 2017-12-22 NOTE — Death Summary Note (Signed)
[facility] [place] NAME: Maria Bass    MR#:  [id number]  DATE OF BIRTH:  1930-02-04  DATE OF ADMISSION:  12/22/17 ADMITTING PHYSICIAN: No admitting provider for patient encounter.  DATE OF DISCHARGE: No discharge date for patient encounter.  PRIMARY CARE PHYSICIAN: Kirk Ruths, MD    ADMISSION DIAGNOSIS:  ems  DISCHARGE DIAGNOSIS:  Active Problems:   ICH (intracerebral hemorrhage) ([place])   SECONDARY DIAGNOSIS:   Past Medical History:  Diagnosis Date  . Anxiety   . Dementia   . Depression   . Hyperlipidemia   . Hypertension   . IBS (irritable bowel syndrome)    per pt- not dx as IBS, but has IBS "symptoms"  . MAI (mycobacterium avium-intracellulare) ([place]) 2003   treated at [facility]  . Skin cancer     HOSPITAL COURSE:  Pronounced dead at 1:28 by ED staff  *ICH Unsurvivable   DISCHARGE CONDITIONS:   Dead  CONSULTS OBTAINED:    DRUG ALLERGIES:  No Known Allergies  DISCHARGE MEDICATIONS:  None  DISCHARGE INSTRUCTIONS:   If you experience worsening of your admission symptoms, develop shortness of breath, life threatening emergency, suicidal or homicidal thoughts you must seek medical attention immediately by calling 911 or calling your MD immediately  if symptoms less severe.  You Must read complete instructions/literature along with all the possible adverse reactions/side effects for all the Medicines you take and that have been prescribed to you. Take any new Medicines after you have completely understood and accept all the possible adverse reactions/side effects.   Please note  You were cared for by a hospitalist during your hospital stay. If you have any questions about your discharge medications or the care you received while you were in the hospital after you are discharged, you can call the unit and asked to speak with the hospitalist on call if the hospitalist that took care of you is not  available. Once you are discharged, your primary care physician will handle any further medical issues. Please note that NO REFILLS for any discharge medications will be authorized once you are discharged, as it is imperative that you return to your primary care physician (or establish a relationship with a primary care physician if you do not have one) for your aftercare needs so that they can reassess your need for medications and monitor your lab values.    Today   CHIEF COMPLAINT:   Chief Complaint  Patient presents with  . Loss of Consciousness    HISTORY OF PRESENT ILLNESS:  84 y.o. female with a known history per below presenting from home with acute unresponsiveness, found to be in acute respiratory failure, status post intubation in the emergency room, underwent head CT-noted to have unsurvivable intracranial hemorrhage, case discussed with ED attending and family, patient was extubated, family request hospice with comfort care management going forward with expectant management.  VITAL SIGNS:  Blood pressure 128/61, pulse 61, resp. rate (!) 22, weight 56.5 kg, SpO2 99 %.  I/O:  No intake or output data in the 24 hours ending Dec 22, 2017 1511  PHYSICAL EXAMINATION:  GENERAL:  84 y.o.-year-old patient lying in the bed with no acute distress.  EYES: Pupils equal, round, reactive to light and accommodation. No scleral icterus. Extraocular muscles intact.  HEENT: Head atraumatic, normocephalic. Oropharynx and nasopharynx clear.  NECK:  Supple, no jugular venous distention. No thyroid enlargement, no tenderness.  LUNGS: Normal breath sounds bilaterally, no wheezing, rales,rhonchi  or crepitation. No use of accessory muscles of respiration.  CARDIOVASCULAR: S1, S2 normal. No murmurs, rubs, or gallops.  ABDOMEN: Soft, non-tender, non-distended. Bowel sounds present. No organomegaly or mass.  EXTREMITIES: No pedal edema, cyanosis, or clubbing.  NEUROLOGIC: Cranial nerves II through XII are  intact. Muscle strength 5/5 in all extremities. Sensation intact. Gait not checked.  PSYCHIATRIC: The patient is alert and oriented x 3.  SKIN: No obvious rash, lesion, or ulcer.   DATA REVIEW:   CBC Recent Labs  Lab 2018-01-03 1121  WBC 19.9*  HGB 12.6  HCT 38.6  PLT 189    Chemistries  Recent Labs  Lab 2018-01-03 1121  NA 144  K 4.2  CL 107  CO2 28  GLUCOSE 128*  BUN 20  CREATININE 0.79  CALCIUM 8.9  AST 37  ALT 19  ALKPHOS 132*  BILITOT 0.5    Cardiac Enzymes Recent Labs  Lab January 03, 2018 1121  TROPONINI 0.05*    Microbiology Results  Results for orders placed or performed during the hospital encounter of 08/30/17  Urine Culture     Status: Abnormal   Collection Time: 08/30/17  9:00 AM  Result Value Ref Range Status   Specimen Description   Final    URINE, RANDOM Performed at [facility], [address] [place], [place] [address]    Special Requests   Final    NONE Performed at [facility], [address] [place], [place] [address]    Culture >=100,000 COLONIES/mL ENTEROCOCCUS FAECALIS (A)  Final   Report Status 09/01/2017 FINAL  Final   Organism ID, Bacteria ENTEROCOCCUS FAECALIS (A)  Final      Susceptibility   Enterococcus faecalis - MIC*    AMPICILLIN <=2 SENSITIVE Sensitive     LEVOFLOXACIN 1 SENSITIVE Sensitive     NITROFURANTOIN <=16 SENSITIVE Sensitive     VANCOMYCIN 2 SENSITIVE Sensitive     * >=100,000 COLONIES/mL ENTEROCOCCUS FAECALIS    RADIOLOGY:  Ct Head Wo Contrast  Result Date: January 03, 2018 CLINICAL DATA:  84 year old female found unresponsive EXAM: CT HEAD WITHOUT CONTRAST TECHNIQUE: Contiguous axial images were obtained from the base of the skull through the vertex without intravenous contrast. COMPARISON:  Prior head CT 09/19/2017 FINDINGS: Brain: Positive for massive intracranial hemorrhage with the epicenter in the left frontal lobe. There is large volume intraparenchymal hemorrhage, intraventricular hemorrhage  and a small amount of subarachnoid hemorrhage. Approximately 2.3 cm left to right midline shift. Cerebral edema diffusely. Complete effacement of the perimesencephalic cisterns and slight crowding of the cerebral tonsils concerning for impending herniation. Intraventricular hemorrhage is present passing through the fourth ventricle. Vascular: No hyperdense vessel or unexpected calcification. Skull: Normal. Negative for fracture or focal lesion. Sinuses/Orbits: No acute finding. Other: None. IMPRESSION: Positive for catastrophic intracranial hemorrhage (6 x 5.6 x 9.6 cm (volume = 200 cm^3)) with the epicenter in the region of the left basal ganglia. There is severe left-to-right midline shift of approximately 2.3 cm and blood throughout the ventricular system. Diffuse cerebral edema with entrapment of the temporal horn of the right ventricular system and crowding of the cerebellar tonsils suggesting impending herniation. Critical Value/emergent results were called by telephone at the time of interpretation on 03-Jan-2018 at 12:23 pm to Dr. Conni Slipper , who verbally acknowledged these results. Electronically Signed   By: Jacqulynn Cadet M.D.   On: 01-03-18 12:23   Dg Chest Portable 1 View  Result Date: January 03, 2018 CLINICAL DATA:  Patient unresponsive.  Status post intubation today.  EXAM: PORTABLE CHEST 1 VIEW COMPARISON:  CT chest 07/03/2016 chest 08/18/2017. FINDINGS: Endotracheal tube is in place with the tip in good position at the level of clavicular heads. NG tube courses into the stomach and below the inferior margin of the film. Scattered areas of mild bronchiectasis and nodularity are seen most consistent with chronic MAI infection as described on report of prior CT. No new airspace disease. Heart size is normal. Aortic atherosclerosis noted. No pneumothorax or pleural effusion. IMPRESSION: ETT and NG tube in good position.  No acute disease. Electronically Signed   By: Inge Rise M.D.   On:  2017/12/25 12:18    EKG:   Orders placed or performed during the hospital encounter of 08/18/17  . ED EKG  . ED EKG  . EKG 12-Lead  . EKG 12-Lead      Management plans discussed with the patient, family and they are in agreement.  CODE STATUS:   TOTAL TIME TAKING CARE OF THIS PATIENT: 35 minutes.    Avel Peace Zackariah Vanderpol M.D on 12-25-17 at 3:11 PM  Between 7am to 6pm - Pager - [phone]  After 6pm go to [email or web address] - password [email or web address] [facility]  Office  [phone]  CC: Primary care physician; Kirk Ruths, MD   Note: This dictation was prepared with Dragon dictation along with smaller phrase technology. Any transcriptional errors that result from this process are unintentional.

## 2017-12-22 NOTE — ED Notes (Signed)
ED Provider at bedside. 

## 2017-12-22 DEATH — deceased

## 2018-02-07 ENCOUNTER — Ambulatory Visit: Payer: Private Health Insurance - Indemnity

## 2018-06-26 ENCOUNTER — Ambulatory Visit: Payer: Medicare Other | Admitting: Neurology

## 2018-09-12 IMAGING — MR MR HEAD WO/W CM
13 series · 48 of 48 positions shown · IV contrast (multihance)
Comparison: None.

CLINICAL DATA: Mild dementia for 1 year.

EXAM:
MRI HEAD WITHOUT AND WITH CONTRAST
TECHNIQUE: Multiplanar, multiecho pulse sequences of the brain and surrounding
structures were obtained without and with intravenous contrast.
CONTRAST:  10mL MULTIHANCE GADOBENATE DIMEGLUMINE 529 MG/ML IV SOLN

[Series 2: T1 · sagittal · 5.0mm · 0.45mm/px · 1 of 25 slices shown (1 of 2)]
[im 1/25]
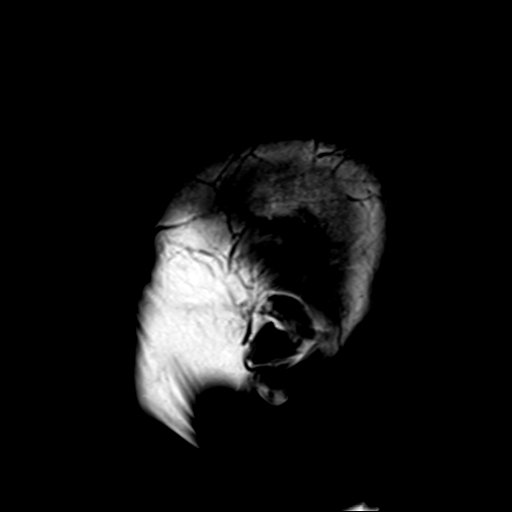

[Series 4: DWI · axial · 3.0mm · 1.80mm/px · z∈[-91,+67]mm · 2 of 54 slices shown (1 of 4)]
[im 1/54]
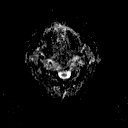
[im 54/54]
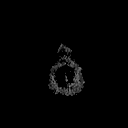

[Series 6: DWI · coronal · 3.0mm · 1.80mm/px · 3 of 45 slices shown (2 of 4)]
[im 1/45]
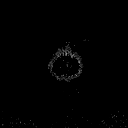
[im 23/45]
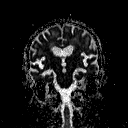
[im 45/45]
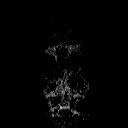

[Series 7: T2 · axial · 5.0mm · 0.60mm/px · z∈[-86,+66]mm · 2 of 25 slices shown (1 of 2)]
[im 1/25]
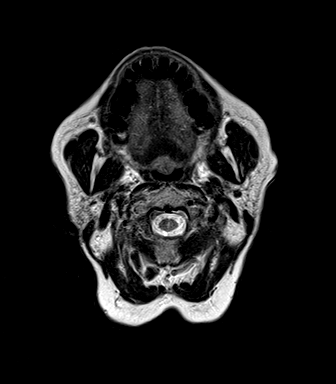
[im 25/25]
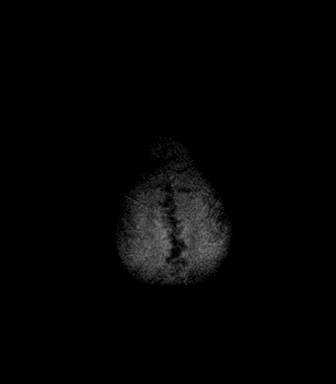

[Series 8: FLAIR · axial · 3.0mm · 0.45mm/px · z∈[-86,+66]mm · 3 of 53 slices shown (1 of 2)]
[im 1/53]
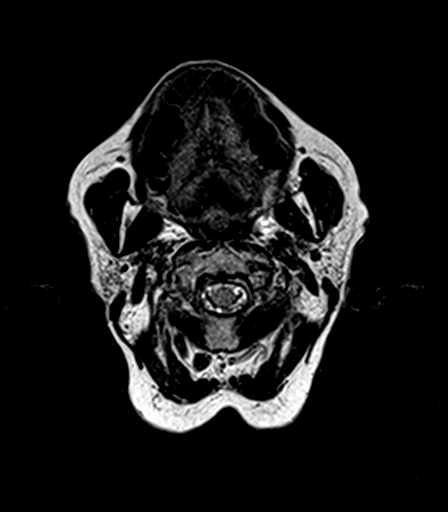
[im 27/53]
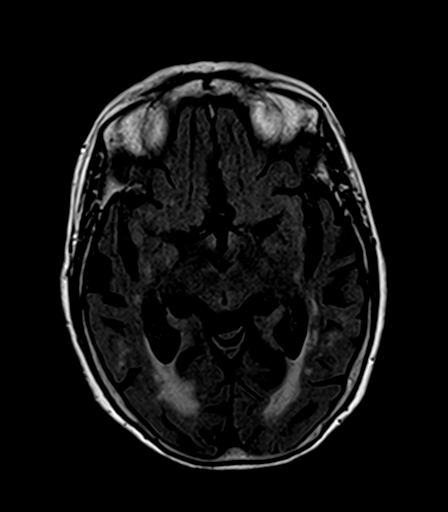
[im 53/53]
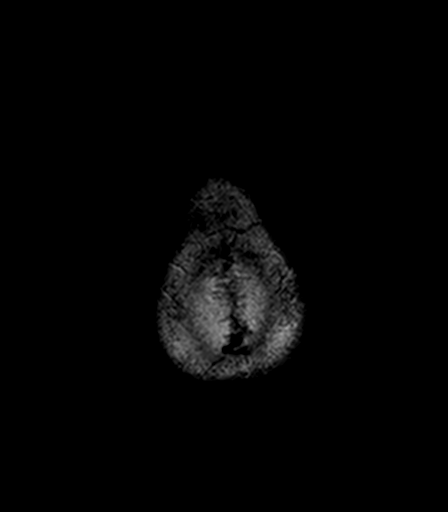

[Series 9: T2 · axial · 5.0mm · 0.45mm/px · z∈[-86,+66]mm · 2 of 25 slices shown (2 of 2)]
[im 1/25]
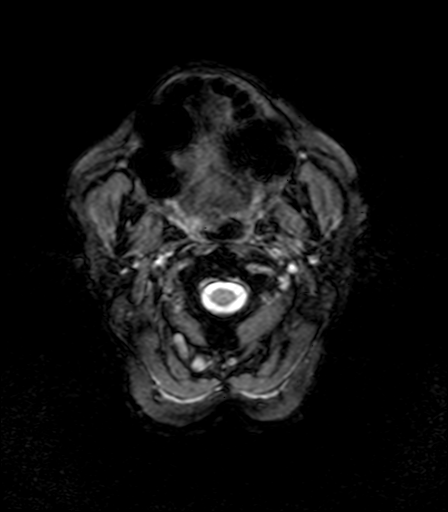
[im 25/25]
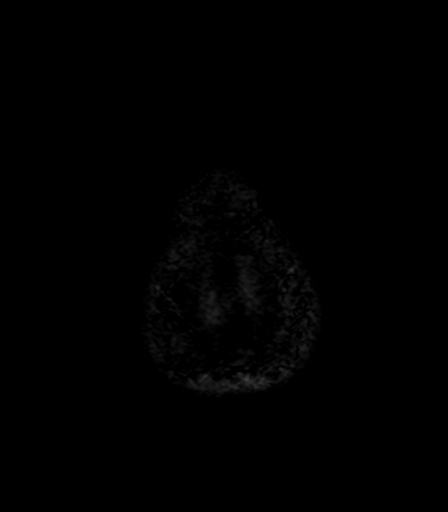

[Series 10: T1 · axial · 1.0mm · 1.00mm/px · z∈[-92,+79]mm · 11 of 176 slices shown (2 of 2)]
[im 1/176]
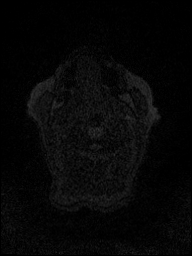
[im 18/176]
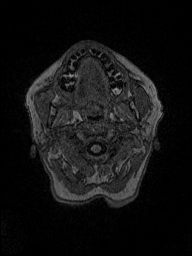
[im 36/176]
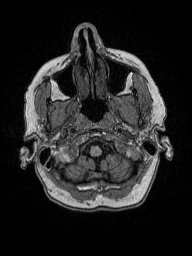
[im 53/176]
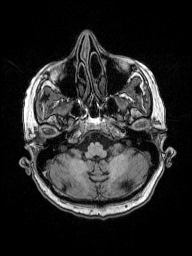
[im 71/176]
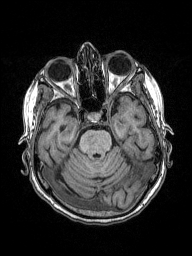
[im 88/176]
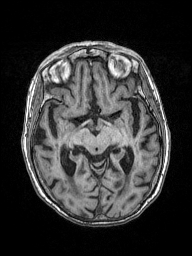
[im 106/176]
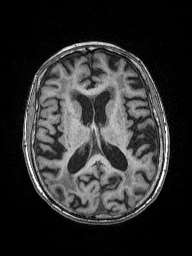
[im 123/176]
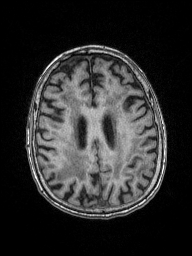
[im 141/176]
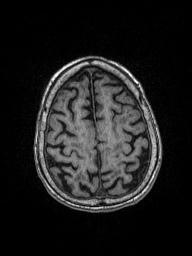
[im 158/176]
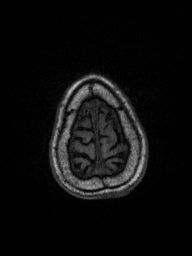
[im 176/176]
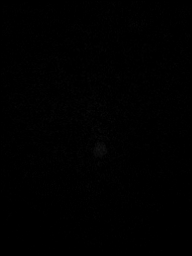

[Series 11: FLAIR · axial · 3.0mm · 0.45mm/px · z∈[-86,+67]mm · 3 of 53 slices shown (2 of 2)]
[im 1/53]
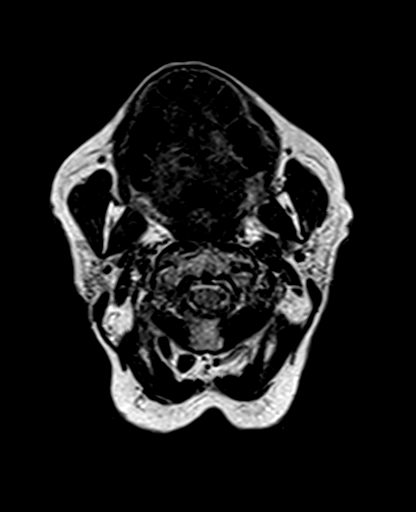
[im 27/53]
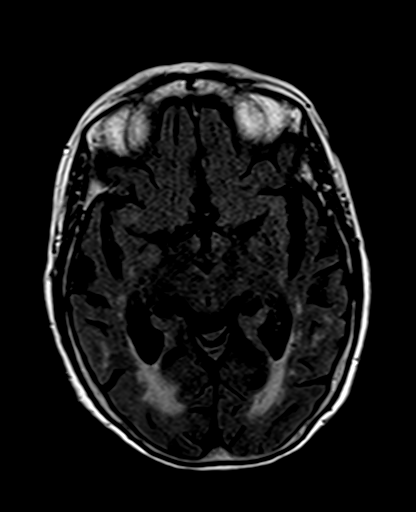
[im 53/53]
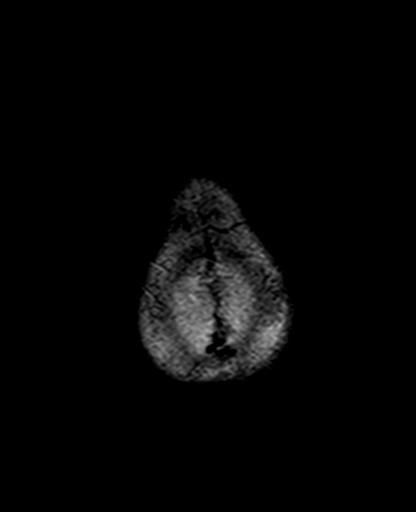

[Series 12: T2 post-contrast · coronal · 5.0mm · 0.49mm/px · 2 of 27 slices shown]
[im 1/27]
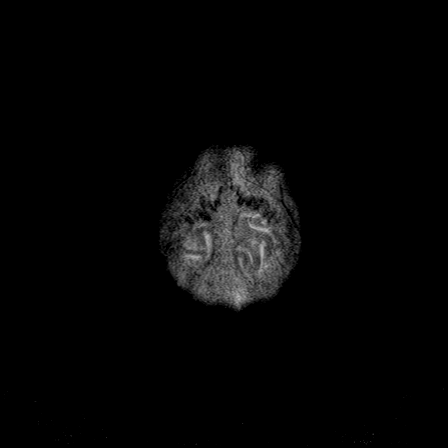
[im 27/27]
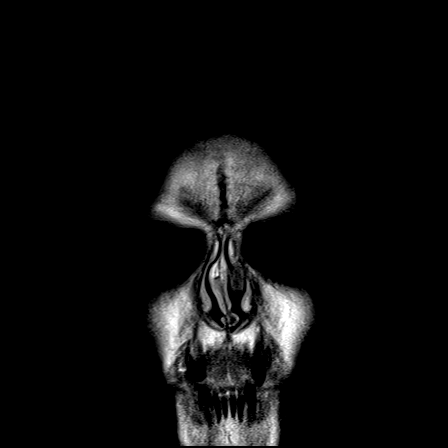

[Series 13: T1 post-contrast · axial · 1.0mm · 1.00mm/px · z∈[-92,+79]mm · 11 of 176 slices shown (1 of 2)]
[im 1/176]
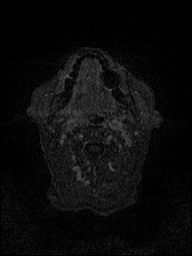
[im 18/176]
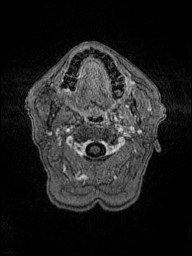
[im 36/176]
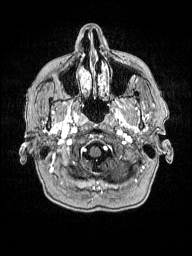
[im 53/176]
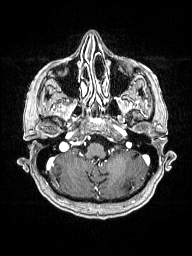
[im 71/176]
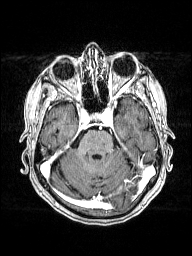
[im 88/176]
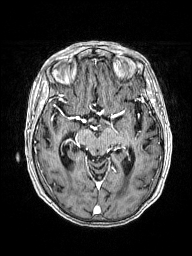
[im 106/176]
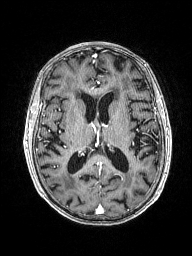
[im 123/176]
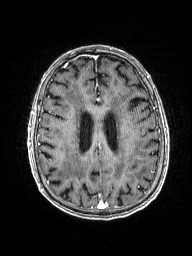
[im 141/176]
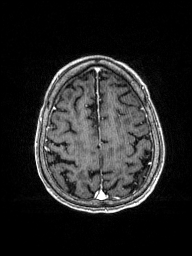
[im 158/176]
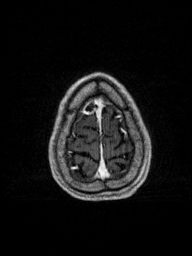
[im 176/176]
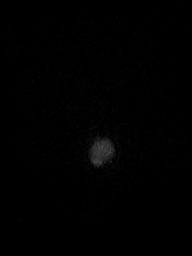

[Series 14: T1 post-contrast · coronal · 5.0mm · 0.43mm/px · 2 of 27 slices shown (2 of 2)]
[im 1/27]
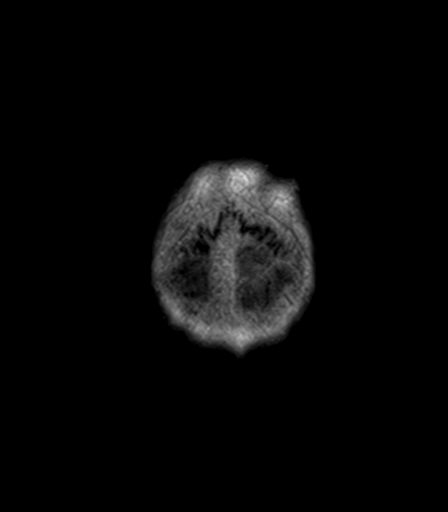
[im 27/27]
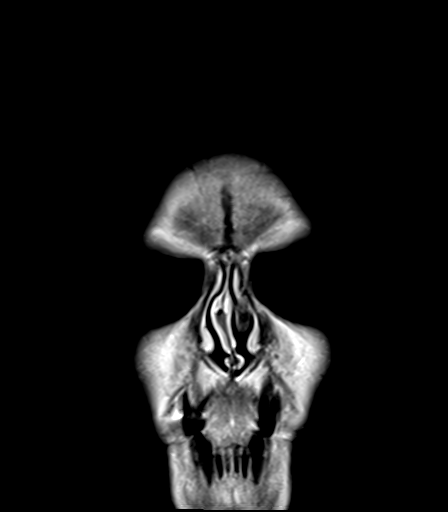

[Series 100: DWI · axial · 3.0mm · 1.80mm/px · z∈[-91,+67]mm · 3 of 50 slices shown (3 of 4)]
[im 1/50]
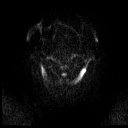
[im 25/50]
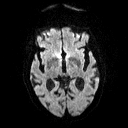
[im 50/50]
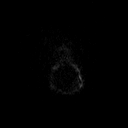

[Series 101: DWI · coronal · 3.0mm · 1.80mm/px · 3 of 45 slices shown (4 of 4)]
[im 1/45]
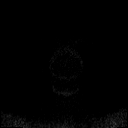
[im 23/45]
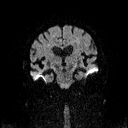
[im 45/45]
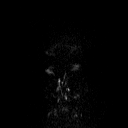

[48 of 48 positions shown; findings below may reference images not displayed]

FINDINGS: Brain: Punctate acute infarct in the high left frontal cortex.

There is a background of severe chronic cerebral white matter
disease with confluent gliosis throughout the white matter, without
mass. This is likely chronic small vessel ischemia, present to a
more moderate degree in the pons. Cerebral volume loss that is mild
for age. Mesial temporal volume loss is mild to moderate. No
extra-axial collection or mass. There are few scattered remote
cortical micro hemorrhages in the right temporal, left temporal
occipital, and right cerebellar regions. Small area of superficial
siderosis near the vertex, involving the postcentral sulcus.

Vascular: Major flow voids are preserved.

Skull and upper cervical spine: Negative.

Sinuses/Orbits: No acute finding.  Bilateral cataract resection.

Other: These results will be called to the ordering clinician or
representative by the Radiologist Assistant, and communication
documented in the PACS or zVision Dashboard.
IMPRESSION: 1. Tiny acute infarct in the left frontal cortex, superimposed on
extensive chronic small vessel ischemia.
2. No reversible explanation for memory loss.
3. Generalized cerebral volume loss that is mild for age.
4. Small area superficial siderosis involving the posterior central
sulcus on the right. Few additional punctate foci of remote
microhemorrhage. Amyloid angiography is possible.
